# Patient Record
Sex: Female | Born: 1970 | Hispanic: No | Smoking: Former smoker
Health system: Southern US, Community
[De-identification: ages and names within clinical notes are randomized; demographics above are authoritative.]

## PROBLEM LIST (undated history)

## (undated) DIAGNOSIS — E559 Vitamin D deficiency, unspecified: Secondary | ICD-10-CM

## (undated) DIAGNOSIS — M25472 Effusion, left ankle: Secondary | ICD-10-CM

## (undated) DIAGNOSIS — M25474 Effusion, right foot: Secondary | ICD-10-CM

## (undated) DIAGNOSIS — R5383 Other fatigue: Secondary | ICD-10-CM

## (undated) DIAGNOSIS — M419 Scoliosis, unspecified: Secondary | ICD-10-CM

## (undated) DIAGNOSIS — H905 Unspecified sensorineural hearing loss: Secondary | ICD-10-CM

## (undated) DIAGNOSIS — H5210 Myopia, unspecified eye: Secondary | ICD-10-CM

## (undated) DIAGNOSIS — J45909 Unspecified asthma, uncomplicated: Secondary | ICD-10-CM

## (undated) DIAGNOSIS — R0602 Shortness of breath: Secondary | ICD-10-CM

## (undated) DIAGNOSIS — I2542 Coronary artery dissection: Secondary | ICD-10-CM

## (undated) DIAGNOSIS — E785 Hyperlipidemia, unspecified: Secondary | ICD-10-CM

## (undated) DIAGNOSIS — M25471 Effusion, right ankle: Secondary | ICD-10-CM

## (undated) DIAGNOSIS — Z86718 Personal history of other venous thrombosis and embolism: Secondary | ICD-10-CM

## (undated) DIAGNOSIS — I2510 Atherosclerotic heart disease of native coronary artery without angina pectoris: Secondary | ICD-10-CM

## (undated) DIAGNOSIS — R079 Chest pain, unspecified: Secondary | ICD-10-CM

## (undated) DIAGNOSIS — M25475 Effusion, left foot: Secondary | ICD-10-CM

## (undated) DIAGNOSIS — I252 Old myocardial infarction: Secondary | ICD-10-CM

## (undated) HISTORY — DX: Shortness of breath: R06.02

## (undated) HISTORY — DX: Effusion, left ankle: M25.472

## (undated) HISTORY — DX: Effusion, right ankle: M25.471

## (undated) HISTORY — DX: Unspecified asthma, uncomplicated: J45.909

## (undated) HISTORY — DX: Scoliosis, unspecified: M41.9

## (undated) HISTORY — DX: Old myocardial infarction: I25.2

## (undated) HISTORY — DX: Effusion, right foot: M25.474

## (undated) HISTORY — DX: Vitamin D deficiency, unspecified: E55.9

## (undated) HISTORY — DX: Personal history of other venous thrombosis and embolism: Z86.718

## (undated) HISTORY — PX: TONSILLECTOMY: SUR1361

## (undated) HISTORY — DX: Myopia, unspecified eye: H52.10

## (undated) HISTORY — DX: Unspecified sensorineural hearing loss: H90.5

## (undated) HISTORY — DX: Other fatigue: R53.83

## (undated) HISTORY — DX: Chest pain, unspecified: R07.9

## (undated) HISTORY — DX: Effusion, left foot: M25.475

## (undated) HISTORY — PX: WISDOM TOOTH EXTRACTION: SHX21

---

## 1993-05-08 HISTORY — PX: LEEP: SHX91

## 1997-08-18 ENCOUNTER — Other Ambulatory Visit: Admission: RE | Admit: 1997-08-18 | Discharge: 1997-08-18 | Payer: Self-pay | Admitting: Obstetrics

## 2005-11-07 ENCOUNTER — Emergency Department (HOSPITAL_COMMUNITY): Admission: EM | Admit: 2005-11-07 | Discharge: 2005-11-07 | Payer: Self-pay | Admitting: Family Medicine

## 2006-09-13 ENCOUNTER — Ambulatory Visit: Payer: Self-pay | Admitting: Vascular Surgery

## 2006-10-23 ENCOUNTER — Ambulatory Visit: Payer: Self-pay | Admitting: Vascular Surgery

## 2007-01-22 ENCOUNTER — Ambulatory Visit: Payer: Self-pay | Admitting: Vascular Surgery

## 2007-04-29 ENCOUNTER — Ambulatory Visit: Payer: Self-pay | Admitting: Vascular Surgery

## 2007-05-10 ENCOUNTER — Ambulatory Visit: Payer: Self-pay | Admitting: Vascular Surgery

## 2007-08-13 ENCOUNTER — Ambulatory Visit: Payer: Self-pay | Admitting: Vascular Surgery

## 2007-08-26 ENCOUNTER — Ambulatory Visit: Payer: Self-pay | Admitting: Vascular Surgery

## 2010-09-20 NOTE — Assessment & Plan Note (Signed)
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  She developed varicosities  during her first pregnancy in 1998 on the right side and eventually  developed the same symptoms on the left side.  She describes throbbing,  aching, and cramping discomfort in the thigh and calf areas on the right  and in the calf area on the left.  She has been wearing elastic  compression stockings and has also tried Motrin and elevation of the  legs and has had no relief of her symptoms.  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  Venous duplex exam was performed  in our office on October 23, 2006, which reveals reflux at the  saphenofemoral junction bilaterally and throughout both greater  saphenous veins.  She would be a good candidate for laser ablation of  both greater saphenous veins with multiple stab phlebectomies, beginning  with the right side to relieve these severe symptoms which she is  experiencing.  We will proceed with precertification and try to perform  this procedure as soon as possible to improve her ability to work.   Quita Skye Hart Rochester, M.D.  Electronically Signed   JDL/MEDQ  D:  01/22/2007  T:  01/23/2007  Job:  [id number]

## 2010-09-20 NOTE — Assessment & Plan Note (Signed)
OFFICE VISIT   Stacey Garner, Stacey Garner  DOB:  July 16, 1970                                       08/13/2007  AVWUJ#:[id number]   The patient returns today for further evaluation of her venous  insufficiency.  She has previously undergone laser ablation of her right  great saphenous vein with multiple stab phlebectomies.  She has had an  excellent cosmetic result and is having no distal edema, is not wearing  elastic compression stockings on the right leg at this time.  She does  have an achy feeling in the leg, however, during the day and felt a  lump near the right inguinal area which she was concerned about.  She  continues to have symptomatic varicosities in her left leg over the  greater saphenous system in the distal thigh and calf which are not  relieved by conservative measures.  She is scheduled for laser ablation  of her left greater saphenous vein with multiple stab phlebectomies on  April 20.   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  By handheld Doppler the saphenous vein  is totally occluded from just adjacent to the saphenofemoral junction  distally on the right side.  No deep venous abnormalities are noted and  she has a palpable dorsalis pedis pulses in the right foot and she was  reassured regarding this.  The left leg has been previously studied and  she will return in 2 weeks for her laser ablation procedure of the left  greater saphenous vein.  She will have stab phlebectomies in the thigh  and calf.  Blood pressure today is 110/70, heart rate is 60,  respirations 14.   Quita Skye. Hart Rochester, M.D.  Electronically Signed   JDL/MEDQ  D:  08/13/2007  T:  08/14/2007  Job:  [id number]

## 2010-09-20 NOTE — Assessment & Plan Note (Signed)
OFFICE VISIT   Stacey Garner, Stacey Garner  DOB:  May 12, 1970                                       05/10/2007  EAVWU#:[id number]   The patient presents today for followup of her right leg greater  saphenous vein laser ablation and stab phlebectomy on 04/29/2007.  She  has done well since her procedure.  She does have the usual amount of  erythema and soreness in her right thigh, and minimal discomfort at her  stab phlebectomy sites.  She does have moderate bruising in her mid  upper thigh and mild bruising around the phlebectomy sites.  The  phlebectomy sites are all healed without difficulty.  She underwent  limited venous duplex in our office today, and this revealed closure and  occlusion of her saphenous vein from her knee to her saphenofemoral  junction.  Her common femoral vein is widely patent with no evidence of  thrombus.  She is reassured with this.  She reports that Motrin has been  upsetting her stomach, and she was encouraged to try Tylenol for  discomfort.  She has been instructed to resume full activity without  limitation, and she will be seen by Dr. Hart Rochester in 6 weeks for final  followup.   Larina Earthly, M.D.  Electronically Signed   TFE/MEDQ  D:  05/10/2007  T:  05/13/2007  Job:  [id number]   cc:   Quita Skye. Hart Rochester, M.D.

## 2012-02-23 ENCOUNTER — Encounter: Payer: Self-pay | Admitting: Medical

## 2012-02-23 ENCOUNTER — Ambulatory Visit (INDEPENDENT_AMBULATORY_CARE_PROVIDER_SITE_OTHER): Payer: BC Managed Care – PPO | Admitting: Medical

## 2012-02-23 ENCOUNTER — Telehealth: Payer: Self-pay | Admitting: Family Medicine

## 2012-02-23 VITALS — BP 118/80 | HR 76 | Temp 98.3°F | Resp 16 | Ht 66.0 in | Wt 195.0 lb

## 2012-02-23 DIAGNOSIS — Z0000 Encounter for general adult medical examination without abnormal findings: Secondary | ICD-10-CM

## 2012-02-23 DIAGNOSIS — J45909 Unspecified asthma, uncomplicated: Secondary | ICD-10-CM

## 2012-02-23 MED ORDER — MOMETASONE FUROATE 220 MCG/INH IN AEPB: 2.0000 | INHALATION_SPRAY | Freq: Every day | RESPIRATORY_TRACT | Status: DC

## 2012-02-23 MED ORDER — ALBUTEROL SULFATE HFA 108 (90 BASE) MCG/ACT IN AERS: 2.0000 | INHALATION_SPRAY | Freq: Four times a day (QID) | RESPIRATORY_TRACT | Status: DC | PRN

## 2012-02-23 NOTE — Patient Instructions (Addendum)

## 2012-02-23 NOTE — Progress Notes (Signed)
Subjective:   HPI  Stacey Garner is a 41 y.o. female who presents for a complete physical.  New patient today.  Sees eye doctor and dentist regularly.  Is mostly vegetarian.  Prior was seeing [facility] for medical issues.  Wants female gyn referral.   Asthma - hx/o asthma, usually no issues, but lately been having some chest tightness, SOB at times.  Started with cough runny nose and sneezing few weeks ago but now mostly tight chest.  No chest pain ,no edema, no palpations. No associated sweats, paresthesias.    Weight gain - 20lb increase over the past year.  Reviewed their medical, surgical, family, social, medication, and allergy history and updated chart as appropriate.  Past Medical History  Diagnosis Date  . Asthma   . Nearsightedness     wears glasses sometimes  . Congenital hearing disorder   . Scoliosis     mild    Past Surgical History  Procedure Date  . Tonsillectomy   . Wisdom tooth extraction   . Leep 1995    Family History  Problem Relation Age of Onset  . Osteoporosis Mother   . Scoliosis Mother   . Alcohol abuse Father     died of ETOH abuse  . Cancer Father     possible pancreatic  cancer  . Thyroid disease Maternal Aunt   . Stroke Paternal Uncle   . Cancer Maternal Grandmother 70    ovarian  . Heart disease Paternal Grandmother   . Diabetes Neg Hx     History   Social History  . Marital Status: Married    Spouse Name: N/A    Number of Children: N/A  . Years of Education: N/A   Occupational History  . social worker [employer]   Social History Main Topics  . Smoking status: Never Smoker   . Smokeless tobacco: Not on file  . Alcohol Use: No  . Drug Use: No  . Sexually Active: Not on file   Other Topics Concern  . Not on file   Social History Narrative   Buddhist, divorced, 2 daughters, age 35yo and 38yo, exercises 3 days per week 45 min each    No current outpatient prescriptions on file prior to visit.      No Known Allergies  Review of Systems Constitutional: -fever, -chills, -sweats, +unexpected weight change, -anorexia, -fatigue Allergy: +sneezing, -itching, -congestion Dermatology: denies changing moles, rash, lumps, new worrisome lesions ENT: -runny nose, -ear pain, -sore throat, -hoarseness, -sinus pain, -teeth pain, -tinnitus, -hearing loss, -epistaxis Cardiology:  -chest pain, -palpitations, -edema, -orthopnea, -paroxysmal nocturnal dyspnea Respiratory: +cough, -shortness of breath, -dyspnea on exertion, -wheezing, -hemoptysis Gastroenterology: -abdominal pain, -nausea, -vomiting, -diarrhea, -constipation, -blood in stool, -changes in bowel movement, -dysphagia Hematology: -bleeding or bruising problems Musculoskeletal: -arthralgias, -myalgias, -joint swelling, -back pain, -neck pain, -cramping, -gait changes Ophthalmology: -vision changes, -eye redness, -itching, -discharge Urology: -dysuria, -difficulty urinating, -hematuria, -urinary frequency, -urgency, incontinence Neurology: -headache, -weakness, -tingling, -numbness, -speech abnormality, -memory loss, -falls, -dizziness Psychology:  -depressed mood, -agitation, -sleep problems     Objective:   Physical Exam  Filed Vitals:   02/23/12 1427  BP: 118/80  Pulse: 76  Temp: 98.3 F (36.8 C)  Resp: 16    General appearance: alert, no distress, WD/WN, white female  Skin: few scattered macules, no worrisome lesions HEENT: normocephalic, conjunctiva/corneas normal, sclerae anicteric, PERRLA, EOMi, nares patent, no discharge or erythema, pharynx normal Oral cavity: MMM, tongue normal, teeth in good repair  Neck: supple, no lymphadenopathy, no thyromegaly, no masses, normal ROM, no bruits Chest: non tender, normal shape and expansion Heart: RRR, normal S1, S2, no murmurs Lungs: CTA bilaterally, no wheezes, rhonchi, or rales Abdomen: +bs, soft, non tender, non distended, no masses, no hepatomegaly, no splenomegaly, no  bruits Back: mild thoracolumbar scoliosis, non tender, normal ROM Musculoskeletal: upper extremities non tender, no obvious deformity, normal ROM throughout, lower extremities non tender, no obvious deformity, normal ROM throughout Extremities: no edema, no cyanosis, no clubbing Pulses: 2+ symmetric, upper and lower extremities, normal cap refill Neurological: alert, oriented x 3, CN2-12 intact, strength normal upper extremities and lower extremities, sensation normal throughout, DTRs 2+ throughout, no cerebellar signs, gait normal Psychiatric: normal affect, behavior normal, pleasant  Breast/gyn/rectal - deferred to gynecology    Assessment and Plan :     Encounter Diagnoses  Name Primary?  . Routine general medical examination at a health care facility Yes  . Asthma     Physical exam - discussed healthy lifestyle, diet, exercise, preventative care, vaccinations, and addressed their concerns.  Handout given.  Referral to female gynecologist at her request.   Asthma - begin Asmanex daily for next 1-2 mo.   albuterol prn.  Discussed proper use, inhaler demo today, discussed difference between controller vs rescue medication.   She declines daily antihistamine tablet, declines flu and pneumococcal vaccines.   Follow-up for fasting labs.

## 2012-02-23 NOTE — Telephone Encounter (Signed)
PATIENT IS AWARE OF HER APPOINTMENT TO SEE DR. MODY ON 03-11-12 AT 245 PM WITH DR. MODY. CLS   [facility] [phone]

## 2012-02-26 LAB — POCT URINALYSIS DIPSTICK
Blood, UA: NEGATIVE
Glucose, UA: NEGATIVE
Nitrite, UA: NEGATIVE
Protein, UA: NEGATIVE
Urobilinogen, UA: NEGATIVE

## 2012-03-26 ENCOUNTER — Other Ambulatory Visit: Payer: BC Managed Care – PPO

## 2012-03-26 DIAGNOSIS — Z0000 Encounter for general adult medical examination without abnormal findings: Secondary | ICD-10-CM

## 2012-03-26 LAB — CBC WITH DIFFERENTIAL/PLATELET
Basophils Relative: 0 % (ref 0–1)
Eosinophils Absolute: 0.1 10*3/uL (ref 0.0–0.7)
Eosinophils Relative: 1 % (ref 0–5)
HCT: 44.2 % (ref 36.0–46.0)
Hemoglobin: 15.2 g/dL — ABNORMAL HIGH (ref 12.0–15.0)
MCH: 30.4 pg (ref 26.0–34.0)
MCHC: 34.4 g/dL (ref 30.0–36.0)
MCV: 88.4 fL (ref 78.0–100.0)
Monocytes Absolute: 0.4 10*3/uL (ref 0.1–1.0)
Monocytes Relative: 8 % (ref 3–12)
Neutrophils Relative %: 62 % (ref 43–77)

## 2012-03-26 LAB — COMPREHENSIVE METABOLIC PANEL
AST: 12 U/L (ref 0–37)
Albumin: 3.9 g/dL (ref 3.5–5.2)
Alkaline Phosphatase: 60 U/L (ref 39–117)
BUN: 13 mg/dL (ref 6–23)
Potassium: 4.2 mEq/L (ref 3.5–5.3)
Total Bilirubin: 0.5 mg/dL (ref 0.3–1.2)

## 2012-03-26 LAB — LIPID PANEL
HDL: 36 mg/dL — ABNORMAL LOW (ref >39)
Total CHOL/HDL Ratio: 5 Ratio
VLDL: 42 mg/dL — ABNORMAL HIGH (ref 0–40)

## 2012-08-12 ENCOUNTER — Emergency Department (HOSPITAL_COMMUNITY): Payer: BC Managed Care – PPO

## 2012-08-12 ENCOUNTER — Encounter (HOSPITAL_COMMUNITY): Payer: Self-pay | Admitting: Nurse Practitioner

## 2012-08-12 ENCOUNTER — Inpatient Hospital Stay (HOSPITAL_COMMUNITY)
Admission: EM | Admit: 2012-08-12 | Discharge: 2012-08-14 | DRG: 121 | Disposition: A | Discharge status: Home / Self Care | Payer: BC Managed Care – PPO | Attending: Cardiovascular Disease | Admitting: Cardiovascular Disease

## 2012-08-12 DIAGNOSIS — Z9089 Acquired absence of other organs: Secondary | ICD-10-CM

## 2012-08-12 DIAGNOSIS — R079 Chest pain, unspecified: Secondary | ICD-10-CM

## 2012-08-12 DIAGNOSIS — K219 Gastro-esophageal reflux disease without esophagitis: Secondary | ICD-10-CM | POA: Diagnosis present

## 2012-08-12 DIAGNOSIS — H918X9 Other specified hearing loss, unspecified ear: Secondary | ICD-10-CM | POA: Diagnosis present

## 2012-08-12 DIAGNOSIS — I2542 Coronary artery dissection: Secondary | ICD-10-CM

## 2012-08-12 DIAGNOSIS — Z87891 Personal history of nicotine dependence: Secondary | ICD-10-CM

## 2012-08-12 DIAGNOSIS — I200 Unstable angina: Secondary | ICD-10-CM

## 2012-08-12 DIAGNOSIS — J45909 Unspecified asthma, uncomplicated: Secondary | ICD-10-CM | POA: Diagnosis present

## 2012-08-12 DIAGNOSIS — I214 Non-ST elevation (NSTEMI) myocardial infarction: Principal | ICD-10-CM

## 2012-08-12 DIAGNOSIS — Z7982 Long term (current) use of aspirin: Secondary | ICD-10-CM

## 2012-08-12 DIAGNOSIS — E785 Hyperlipidemia, unspecified: Secondary | ICD-10-CM

## 2012-08-12 DIAGNOSIS — I2510 Atherosclerotic heart disease of native coronary artery without angina pectoris: Secondary | ICD-10-CM

## 2012-08-12 DIAGNOSIS — Z79899 Other long term (current) drug therapy: Secondary | ICD-10-CM

## 2012-08-12 DIAGNOSIS — M4120 Other idiopathic scoliosis, site unspecified: Secondary | ICD-10-CM | POA: Diagnosis present

## 2012-08-12 HISTORY — DX: Coronary artery dissection: I25.42

## 2012-08-12 HISTORY — DX: Hyperlipidemia, unspecified: E78.5

## 2012-08-12 HISTORY — DX: Atherosclerotic heart disease of native coronary artery without angina pectoris: I25.10

## 2012-08-12 LAB — CBC
MCH: 30.2 pg (ref 26.0–34.0)
MCHC: 35.1 g/dL (ref 30.0–36.0)
Platelets: 167 10*3/uL (ref 150–400)

## 2012-08-12 LAB — PRO B NATRIURETIC PEPTIDE: Pro B Natriuretic peptide (BNP): 11.2 pg/mL (ref 0–125)

## 2012-08-12 LAB — D-DIMER, QUANTITATIVE: D-Dimer, Quant: 0.47 ug/mL-FEU (ref 0.00–0.48)

## 2012-08-12 LAB — BASIC METABOLIC PANEL
Calcium: 9 mg/dL (ref 8.4–10.5)
GFR calc non Af Amer: >90 mL/min (ref >90)
Glucose, Bld: 121 mg/dL — ABNORMAL HIGH (ref 70–99)
Sodium: 140 mEq/L (ref 135–145)

## 2012-08-12 LAB — TROPONIN I
Troponin I: 1 ng/mL (ref <0.30)
Troponin I: 0.99 ng/mL (ref <0.30)

## 2012-08-12 MED ORDER — ASPIRIN EC 81 MG PO TBEC
81.0000 mg | DELAYED_RELEASE_TABLET | Freq: Every day | ORAL | Status: DC
  Administered 2012-08-14: 81 mg via ORAL
  Filled 2012-08-12 (×2): qty 1

## 2012-08-12 MED ORDER — SODIUM CHLORIDE 0.9 % IJ SOLN
3.0000 mL | Freq: Two times a day (BID) | INTRAMUSCULAR | Status: DC
  Administered 2012-08-13: 3 mL via INTRAVENOUS

## 2012-08-12 MED ORDER — ALPRAZOLAM 0.25 MG PO TABS
0.2500 mg | ORAL_TABLET | Freq: Two times a day (BID) | ORAL | Status: DC | PRN
  Administered 2012-08-12: 0.25 mg via ORAL
  Filled 2012-08-12: qty 1

## 2012-08-12 MED ORDER — HEPARIN BOLUS VIA INFUSION
4000.0000 [IU] | Freq: Once | INTRAVENOUS | Status: AC
  Administered 2012-08-12: 4000 [IU] via INTRAVENOUS

## 2012-08-12 MED ORDER — NITROGLYCERIN 0.4 MG SL SUBL
0.4000 mg | SUBLINGUAL_TABLET | SUBLINGUAL | Status: DC | PRN
  Administered 2012-08-12 (×2): 0.4 mg via SUBLINGUAL
  Filled 2012-08-12: qty 25

## 2012-08-12 MED ORDER — SODIUM CHLORIDE 0.9 % IV SOLN
INTRAVENOUS | Status: DC
  Administered 2012-08-13: 150 mL/h via INTRAVENOUS
  Administered 2012-08-13: 13:00:00 via INTRAVENOUS
  Administered 2012-08-13: 150 mL/h via INTRAVENOUS

## 2012-08-12 MED ORDER — ONDANSETRON HCL 4 MG/2ML IJ SOLN: 4.0000 mg | Freq: Four times a day (QID) | INTRAMUSCULAR | Status: DC | PRN

## 2012-08-12 MED ORDER — SODIUM CHLORIDE 0.9 % IV SOLN: 250.0000 mL | INTRAVENOUS | Status: DC | PRN

## 2012-08-12 MED ORDER — SODIUM CHLORIDE 0.9 % IJ SOLN: 3.0000 mL | INTRAMUSCULAR | Status: DC | PRN

## 2012-08-12 MED ORDER — ASPIRIN 81 MG PO CHEW
324.0000 mg | CHEWABLE_TABLET | Freq: Once | ORAL | Status: AC
  Administered 2012-08-12: 324 mg via ORAL
  Filled 2012-08-12: qty 4

## 2012-08-12 MED ORDER — ATORVASTATIN CALCIUM 80 MG PO TABS
80.0000 mg | ORAL_TABLET | Freq: Every day | ORAL | Status: DC
  Administered 2012-08-13 – 2012-08-14 (×2): 80 mg via ORAL
  Filled 2012-08-12 (×2): qty 1

## 2012-08-12 MED ORDER — ACETAMINOPHEN 325 MG PO TABS
650.0000 mg | ORAL_TABLET | ORAL | Status: DC | PRN
  Administered 2012-08-13: 650 mg via ORAL
  Filled 2012-08-12: qty 2

## 2012-08-12 MED ORDER — SODIUM CHLORIDE 0.9 % IJ SOLN
3.0000 mL | Freq: Two times a day (BID) | INTRAMUSCULAR | Status: DC
  Administered 2012-08-12: 3 mL via INTRAVENOUS
  Administered 2012-08-13: 08:00:00 via INTRAVENOUS

## 2012-08-12 MED ORDER — SODIUM CHLORIDE 0.9 % IV SOLN
250.0000 mL | INTRAVENOUS | Status: DC | PRN
  Administered 2012-08-12: 10 mL/h via INTRAVENOUS

## 2012-08-12 MED ORDER — ASPIRIN 325 MG PO TABS: 325.0000 mg | ORAL_TABLET | Freq: Once | ORAL | Status: DC

## 2012-08-12 MED ORDER — METOPROLOL TARTRATE 12.5 MG HALF TABLET
12.5000 mg | ORAL_TABLET | Freq: Two times a day (BID) | ORAL | Status: DC
  Administered 2012-08-12 – 2012-08-14 (×4): 12.5 mg via ORAL
  Filled 2012-08-12 (×5): qty 1

## 2012-08-12 MED ORDER — ZOLPIDEM TARTRATE 5 MG PO TABS: 5.0000 mg | ORAL_TABLET | Freq: Every evening | ORAL | Status: DC | PRN

## 2012-08-12 MED ORDER — HEPARIN (PORCINE) IN NACL 100-0.45 UNIT/ML-% IJ SOLN
1200.0000 [IU]/h | INTRAMUSCULAR | Status: DC
  Administered 2012-08-12: 1200 [IU]/h via INTRAVENOUS
  Filled 2012-08-12 (×2): qty 250

## 2012-08-12 MED ORDER — ASPIRIN 81 MG PO CHEW
324.0000 mg | CHEWABLE_TABLET | ORAL | Status: AC
  Administered 2012-08-13: 324 mg via ORAL
  Filled 2012-08-12: qty 4

## 2012-08-12 NOTE — Progress Notes (Signed)
ANTICOAGULATION CONSULT NOTE - Initial Consult  Pharmacy Consult for heparin Indication: chest pain/ACS  No Known Allergies  Patient Measurements:   Heparin Dosing Weight: 85kg  Vital Signs: Temp: 98.2 F (36.8 C) (04/07 1228) Temp src: Oral (04/07 1228) BP: 152/82 mmHg (04/07 1700) Pulse Rate: 90 (04/07 1700)  Labs:  Recent Labs  08/12/12 1308 08/12/12 1440  HGB 15.6*  --   HCT 44.5  --   PLT 167  --   CREATININE 0.77  --   TROPONINI  --  1.00*    The CrCl is unknown because both a height and weight (above a minimum accepted value) are required for this calculation.   Medical History: Past Medical History  Diagnosis Date  . Asthma   . Nearsightedness     wears glasses sometimes  . Congenital hearing disorder   . Scoliosis     mild    Medications:  None pta  Assessment: 42 year old with chest pain earlier today and now with positive cardiac enzymes. No prior cardiac history. Orders to initiate IV heparin and plan for cath in am 4/8. CBC within normal limits.  Goal of Therapy:  Heparin level 0.3-0.7 units/ml Monitor platelets by anticoagulation protocol: Yes   Plan:  Give 4000 units bolus x 1 Start heparin infusion at 1200 units/hr Check anti-Xa level in 6 hours and daily while on heparin Continue to monitor H&H and platelets  Sheppard Coil PharmD., BCPS Clinical Pharmacist Pager [phone] 08/12/2012 6:39 PM

## 2012-08-12 NOTE — ED Provider Notes (Signed)
History     CSN: [id number]  Arrival date & time 08/12/12  1220   First MD Initiated Contact with Patient 08/12/12 1231      Chief Complaint  Patient presents with  . Chest Pain    (Consider location/radiation/quality/duration/timing/severity/associated sxs/prior treatment) HPI.... sudden onset stabbing anterior chest pain radiating to back and left arm just prior to admission. No nausea, diaphoresis, dyspnea. No previous cardiac problems.  Nonsmoker. Has hypercholesterolemia, but is on no medication. No family history of cardiac disease.  Nothing makes symptoms better or worse. Severity was moderate to severe.  Past Medical History  Diagnosis Date  . Asthma   . Nearsightedness     wears glasses sometimes  . Congenital hearing disorder   . Scoliosis     mild    Past Surgical History  Procedure Laterality Date  . Tonsillectomy    . Wisdom tooth extraction    . Leep  1995    Family History  Problem Relation Age of Onset  . Osteoporosis Mother   . Scoliosis Mother   . Alcohol abuse Father     died of ETOH abuse  . Cancer Father     possible pancreatic  cancer  . Thyroid disease Maternal Aunt   . Stroke Paternal Uncle   . Cancer Maternal Grandmother 70    ovarian  . Heart disease Paternal Grandmother   . Diabetes Neg Hx     History  Substance Use Topics  . Smoking status: Never Smoker   . Smokeless tobacco: Not on file  . Alcohol Use: No    OB History   Grav Para Term Preterm Abortions TAB SAB Ect Mult Living                  Review of Systems  All other systems reviewed and are negative.    Allergies  Review of patient's allergies indicates no known allergies.  Home Medications  No current outpatient prescriptions on file.  BP 141/91  Pulse 77  Temp(Src) 98.2 F (36.8 C) (Oral)  Resp 20  SpO2 99%  LMP 08/10/2012  Physical Exam  Nursing note and vitals reviewed. Constitutional: She is oriented to person, place, and time. She appears  well-developed and well-nourished.  HENT:  Head: Normocephalic and atraumatic.  Eyes: Conjunctivae and EOM are normal. Pupils are equal, round, and reactive to light.  Neck: Normal range of motion. Neck supple.  Cardiovascular: Normal rate, regular rhythm and normal heart sounds.   Pulmonary/Chest: Effort normal and breath sounds normal.  Abdominal: Soft. Bowel sounds are normal.  Musculoskeletal: Normal range of motion.  Neurological: She is alert and oriented to person, place, and time.  Skin: Skin is warm and dry.  Psychiatric: She has a normal mood and affect.    ED Course  Procedures (including critical care time)  Labs Reviewed  CBC - Abnormal; Notable for the following:    RBC 5.16 (*)    Hemoglobin 15.6 (*)    All other components within normal limits  BASIC METABOLIC PANEL - Abnormal; Notable for the following:    Glucose, Bld 121 (*)    All other components within normal limits  TROPONIN I - Abnormal; Notable for the following:    Troponin I 1.00 (*)    All other components within normal limits  POCT I-STAT TROPONIN I - Abnormal; Notable for the following:    Troponin i, poc 0.09 (*)    All other components within normal limits  PRO B NATRIURETIC  PEPTIDE   Dg Chest 2 View  08/12/2012  *RADIOLOGY REPORT*  Clinical Data: Chest pain  CHEST - 2 VIEW  Comparison: None.  Findings: The heart and pulmonary vascularity are within normal limits.  The lungs are clear bilaterally.  No acute bony abnormality is seen.  IMPRESSION: No acute abnormality noted.   Original Report Authenticated By: Alcide Clever, M.D.     Date: 08/12/2012  Rate: 77  Rhythm: normal sinus rhythm  QRS Axis: normal  Intervals: normal  ST/T Wave abnormalities: normal  Conduction Disutrbances: none  Narrative Interpretation: unremarkable   Date: 08/12/2012  Rate: 84  Rhythm: normal sinus rhythm  QRS Axis: normal  Intervals: normal  ST/T Wave abnormalities: normal  Conduction Disutrbances: none   Narrative Interpretation: unremarkable      No diagnosis found.    MDM  Patient has good history for potential acute coronary syndrome.  Risk factor profile low.  Serum troponin 1.00.   EKG normal.  Consult cardiology         Donnetta Hutching, MD 08/12/12 [phone]

## 2012-08-12 NOTE — H&P (Signed)
CONSULT NOTE  Date: 08/12/2012               Patient Name:  Stacey Garner MRN: [id number]  DOB: 10/16/1970 Age / Sex: 42 y.o., female        PCP: Ernst Breach Primary Cardiologist: New to [facility]            Referring Physician: Donnetta Hutching, MD              Reason for Consult: Chest pain. + troponin           History of Present Illness: Patient is a 41 y.o. female with no significant PMH, who was admitted to [facility] on 08/12/2012 for evaluation of chest pain  She was driving back to school after a conference.  She developed a sharp CP, radiated to her left arm.  This severe She got out of her car, and had still had a dull tightness in her chest.   The pain radiated to her back.   The pain gradually eased after 15 minutes.  A co-worker at her school told her to go to the ER.   No pleuretic cp, no nausea, vomitting, .no dyspnea  She does not get any regular exercise.  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  Medications: Outpatient medications: none  (Not in a hospital admission)  Current medications: No current facility-administered medications for this encounter.   No current outpatient prescriptions on file.     No Known Allergies   Past Medical History  Diagnosis Date  . Asthma   . Nearsightedness     wears glasses sometimes  . Congenital hearing disorder   . Scoliosis     mild    Past Surgical History  Procedure Laterality Date  . Tonsillectomy    . Wisdom tooth extraction    . Leep  1995    Family History  Problem Relation Age of Onset  . Osteoporosis Mother   . Scoliosis Mother   . Alcohol abuse Father     died of ETOH abuse  . Cancer Father     possible pancreatic  cancer  . Thyroid disease Maternal Aunt   . Stroke Paternal Uncle   . Cancer Maternal Grandmother 70    ovarian  . Heart disease Paternal Grandmother   . Diabetes Neg  Hx     Social History:  reports that she has never smoked. She does not have any smokeless tobacco history on file. She reports that she does not drink alcohol or use illicit drugs.   Review of Systems: Constitutional:  denies fever, chills, diaphoresis, appetite change and fatigue.  HEENT: denies photophobia, eye pain, redness, hearing loss, ear pain, congestion, sore throat, rhinorrhea, sneezing, neck pain, neck stiffness and tinnitus.  Respiratory: admits to  chest tightness.  Denies  wheezing.  Cardiovascular: admits to chest pain, denies palpitations and leg swelling.  Gastrointestinal: denies nausea, vomiting, abdominal pain, diarrhea, constipation, blood in stool.  Genitourinary: denies dysuria, urgency, frequency, hematuria, flank pain and difficulty urinating.  Musculoskeletal: denies  myalgias, back pain, joint swelling, arthralgias and gait problem.   Skin: denies pallor, rash and wound.  Neurological: denies dizziness, seizures, syncope, weakness, light-headedness, numbness and headaches.   Hematological: denies adenopathy, easy bruising, personal or family bleeding history.  Psychiatric/ Behavioral: denies suicidal ideation, mood changes, confusion, nervousness, sleep disturbance  and agitation.    Physical Exam: BP 152/82  Pulse 90  Temp(Src) 98.2 F (36.8 C) (Oral)  Resp 17  SpO2 100%  LMP 08/10/2012  General: Vital signs reviewed and noted. Well-developed, well-nourished, in no acute distress; alert, appropriate and cooperative throughout examination.  Head: Normocephalic, atraumatic, sclera anicteric, mucus membranes are moist  Neck: Supple. Negative for carotid bruits. JVD not elevated.  Lungs:  Clear bilaterally to auscultation without wheezes, rales, or rhonchi.   Heart: RRR with S1 S2. No murmurs, rubs, or gallops appreciated.  Abdomen:  Soft, non-tender, non-distended with normoactive bowel sounds. No hepatomegaly. No rebound/guarding. No obvious abdominal  masses  MSK: Strength and the appear normal for age.  Extremities: No clubbing or cyanosis. No edema.  Distal pedal pulses are 2+ and equal bilaterally.  Neurologic: Alert and oriented X 3. Moves all extremities spontaneously.  Psych: Responds to questions appropriately with a normal affect.    Lab results: Basic Metabolic Panel:  Recent Labs Lab 08/12/12 1308  NA 140  K 3.7  CL 103  CO2 28  GLUCOSE 121*  BUN 13  CREATININE 0.77  CALCIUM 9.0    Liver Function Tests: No results found for this basename: AST, ALT, ALKPHOS, BILITOT, PROT, ALBUMIN,  in the last 168 hours No results found for this basename: LIPASE, AMYLASE,  in the last 168 hours No results found for this basename: AMMONIA,  in the last 168 hours  CBC:  Recent Labs Lab 08/12/12 1308  WBC 6.1  HGB 15.6*  HCT 44.5  MCV 86.2  PLT 167    Cardiac Enzymes:  Recent Labs Lab 08/12/12 1440  TROPONINI 1.00*    BNP: No components found with this basename: POCBNP,   CBG: No results found for this basename: GLUCAP,  in the last 168 hours  Coagulation Studies: No results found for this basename: LABPROT, INR,  in the last 72 hours   Other results: EKG :  NSR, no ST or T wave changes  Imaging: Dg Chest 2 View  08/12/2012  *RADIOLOGY REPORT*  Clinical Data: Chest pain  CHEST - 2 VIEW  Comparison: None.  Findings: The heart and pulmonary vascularity are within normal limits.  The lungs are clear bilaterally.  No acute bony abnormality is seen.  IMPRESSION: No acute abnormality noted.   Original Report Authenticated By: Alcide Clever, M.D.        Assessment & Plan: 1. Chest pain:  She had a fairly serious episode of CP earlier today.  Sharp, severe CP for 5 minutes followed by 15-20 minutes of a dull, tight pain.  Her Troponin levels are 1.0  Her ECG is normal.   She denies any pleuretic CP and is not dyspneac-- I doubt this is a pulmonary embolus.   I worry about a coronary dissection.    Will start  her on heparin tonight.  I have discussed risks, benefits, options.  She understands and agrees to proceed.  Will schedule for tomorrow.  Will check lipids tomorrow.     Vesta Mixer, Montez Hageman., MD, [facility] 08/12/2012, 5:56 PM

## 2012-08-12 NOTE — ED Notes (Signed)
Pt reports L chest tightness and L shoulder pain similar to the incident that brought her to [facility]. Will administer ntg sl.

## 2012-08-12 NOTE — ED Notes (Signed)
States she was driving 40 minutes ago and felt a sharp stabbing pain in her chest down her L shoulder, arm and back. States pain decreased but a mild tightness remains. Pt denies SOB or nausea. States she feels scared right now

## 2012-08-13 ENCOUNTER — Encounter (HOSPITAL_COMMUNITY)
Admission: EM | Disposition: A | Discharge status: Home / Self Care | Payer: Self-pay | Source: Home / Self Care | Attending: Cardiovascular Disease

## 2012-08-13 DIAGNOSIS — I2542 Coronary artery dissection: Secondary | ICD-10-CM

## 2012-08-13 DIAGNOSIS — I2510 Atherosclerotic heart disease of native coronary artery without angina pectoris: Secondary | ICD-10-CM

## 2012-08-13 HISTORY — PX: CARDIAC CATHETERIZATION: SHX172

## 2012-08-13 HISTORY — DX: Coronary artery dissection: I25.42

## 2012-08-13 HISTORY — DX: Atherosclerotic heart disease of native coronary artery without angina pectoris: I25.10

## 2012-08-13 HISTORY — PX: LEFT HEART CATHETERIZATION WITH CORONARY ANGIOGRAM: SHX5451

## 2012-08-13 LAB — CBC
HCT: 43.5 % (ref 36.0–46.0)
MCHC: 35.6 g/dL (ref 30.0–36.0)
MCV: 86.8 fL (ref 78.0–100.0)
RDW: 11.8 % (ref 11.5–15.5)

## 2012-08-13 LAB — LIPID PANEL
HDL: 43 mg/dL (ref >39)
LDL Cholesterol: 169 mg/dL — ABNORMAL HIGH (ref 0–99)
Triglycerides: 96 mg/dL (ref <150)
VLDL: 19 mg/dL (ref 0–40)

## 2012-08-13 LAB — TSH: TSH: 3.01 u[IU]/mL (ref 0.350–4.500)

## 2012-08-13 LAB — TROPONIN I: Troponin I: 0.47 ng/mL (ref <0.30)

## 2012-08-13 SURGERY — LEFT HEART CATHETERIZATION WITH CORONARY ANGIOGRAM
Anesthesia: LOCAL

## 2012-08-13 MED ORDER — HEPARIN (PORCINE) IN NACL 2-0.9 UNIT/ML-% IJ SOLN
INTRAMUSCULAR | Status: AC
  Filled 2012-08-13: qty 1000

## 2012-08-13 MED ORDER — MIDAZOLAM HCL 2 MG/2ML IJ SOLN
INTRAMUSCULAR | Status: AC
  Filled 2012-08-13: qty 2

## 2012-08-13 MED ORDER — SODIUM CHLORIDE 0.9 % IV SOLN: 1.0000 mL/kg/h | INTRAVENOUS | Status: AC

## 2012-08-13 MED ORDER — HEPARIN SODIUM (PORCINE) 1000 UNIT/ML IJ SOLN
INTRAMUSCULAR | Status: AC
  Filled 2012-08-13: qty 1

## 2012-08-13 MED ORDER — FENTANYL CITRATE 0.05 MG/ML IJ SOLN
INTRAMUSCULAR | Status: AC
  Filled 2012-08-13: qty 2

## 2012-08-13 MED ORDER — VERAPAMIL HCL 2.5 MG/ML IV SOLN
INTRAVENOUS | Status: AC
  Filled 2012-08-13: qty 2

## 2012-08-13 MED ORDER — LIDOCAINE HCL (PF) 1 % IJ SOLN
INTRAMUSCULAR | Status: AC
  Filled 2012-08-13: qty 30

## 2012-08-13 NOTE — CV Procedure (Signed)
   Cardiac Catheterization Procedure Note  Name: Stacey Garner MRN: [id number] DOB: 1970/08/27  Procedure: Left Heart Cath, Selective Coronary Angiography, LV angiography  Indication: 42 yo F presents with an episode of severe chest pain with elevated troponin levels.   Procedural Details: The right wrist was prepped, draped, and anesthetized with 1% lidocaine. Using the modified Seldinger technique, a 5 French sheath was introduced into the right radial artery. 3 mg of verapamil was administered through the sheath, weight-based unfractionated heparin was administered intravenously. Standard Judkins catheters were used for selective coronary angiography and left ventriculography. Catheter exchanges were performed over an exchange length guidewire. There were no immediate procedural complications. A TR band was used for radial hemostasis at the completion of the procedure.  The patient was transferred to the post catheterization recovery area for further monitoring.  Procedural Findings: Hemodynamics: AO 106/65 mean 84 mm Hg LV 112/11 mm Hg  Coronary angiography: Coronary dominance: right  Left mainstem: Normal  Left anterior descending (LAD): Normal.  Left circumflex (LCx): The left circumflex gives rise to 2 marginal branches. The first marginal branch is diffusely narrowed from the proximal to mid vessel to 50-60%. There is occlusion of a small distal branch with dye staining. The second OM is normal.  Right coronary artery (RCA): Normal.  Left ventriculography: Left ventricular systolic function is normal, LVEF is estimated at 55-65%, there is no significant mitral regurgitation   Final Conclusions:   1. Single vessel coronary artery disease. 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 2. Normal LV function.  Recommendations: Medical management with ASA, beta blocker, and statin. Prn nitrates.  Theron Arista [facility] 08/13/2012, 2:41 PM

## 2012-08-13 NOTE — Progress Notes (Signed)
ANTICOAGULATION CONSULT NOTE - Follow Up Consult  Pharmacy Consult for heparin Indication: chest pain/ACS  No Known Allergies  Patient Measurements: Height: 5\' 5"  (165.1 cm) Weight: 195 lb 5.2 oz (88.6 kg) IBW/kg (Calculated) : 57 Heparin Dosing Weight: 85kg  Vital Signs: Temp: 98.7 F (37.1 C) (04/08 0400) Temp src: Oral (04/08 0400) BP: 116/75 mmHg (04/08 0400) Pulse Rate: 65 (04/08 0400)  Labs:  Recent Labs  08/12/12 1308 08/12/12 1440 08/12/12 2202 08/13/12 0425  HGB 15.6*  --   --  15.5*  HCT 44.5  --   --  43.5  PLT 167  --   --  185  LABPROT  --   --   --  13.6  INR  --   --   --  1.05  HEPARINUNFRC  --   --   --  0.39  CREATININE 0.77  --   --   --   TROPONINI  --  1.00* 0.99*  --     Estimated Creatinine Clearance: 103.7 ml/min (by C-G formula based on Cr of 0.77).   Medical History: Past Medical History  Diagnosis Date  . Asthma   . Nearsightedness     wears glasses sometimes  . Congenital hearing disorder   . Scoliosis     mild    Medications:  None pta  Assessment: 42 year old with chest pain earlier today and now with positive cardiac enzymes on IV heparin and plan for cath in am 4/8.   Heparin level (0.39) is at-goal on 1200 units/hr.   Goal of Therapy:  Heparin level 0.3-0.7 units/ml Monitor platelets by anticoagulation protocol: Yes   Plan:  1. Continue IV heparin at 1200 units/hr.  2. Heparin level in 6 hours to confirm dosing vs follow-up post-cath.   Lorre Munroe, PharmD 08/13/2012 5:13 AM

## 2012-08-13 NOTE — Interval H&P Note (Signed)
History and Physical Interval Note:  08/13/2012 2:07 PM  Stacey Garner  has presented today for surgery, with the diagnosis of Chest pain  The various methods of treatment have been discussed with the patient and family. After consideration of risks, benefits and other options for treatment, the patient has consented to  Procedure(s): LEFT HEART CATHETERIZATION WITH CORONARY ANGIOGRAM (N/A) as a surgical intervention .  The patient's history has been reviewed, patient examined, no change in status, stable for surgery.  I have reviewed the patient's chart and labs.  Questions were answered to the patient's satisfaction.     Theron [facility]ista [facility] 08/13/2012 2:07 PM

## 2012-08-13 NOTE — Care Management Note (Signed)
    Page 1 of 1   08/13/2012     9:35:46 AM   CARE MANAGEMENT NOTE 08/13/2012  Patient:  Stacey Garner, Stacey Garner   Account Number:  [id number]  Date Initiated:  08/13/2012  Documentation initiated by:  Junius Creamer  Subjective/Objective Assessment:   adm w angina     Action/Plan:   lives w husband, pcp dr Onalee Hua tysinger   Anticipated DC Date:     Anticipated DC Plan:  HOME/SELF CARE      DC Planning Services  CM consult      Choice offered to / List presented to:             Status of service:   Medicare Important Message given?   (If response is "NO", the following Medicare IM given date fields will be blank) Date Medicare IM given:   Date Additional Medicare IM given:    Discharge Disposition:  HOME/SELF CARE  Per UR Regulation:  Reviewed for med. necessity/level of care/duration of stay  If discussed at Long Length of Stay Meetings, dates discussed:    Comments:  4/8 0934 debbie Stacey Moccio rn,bsn

## 2012-08-13 NOTE — Progress Notes (Signed)
 CONSULT NOTE  Date: 08/13/2012               Patient Name:  Stacey Garner MRN: [id number]  DOB: 05/13/1970 Age / Sex: 41 y.o., female        PCP: TYSINGER, DAVID SHANE Primary Cardiologist: New to [facility]            Referring Physician: Brian Cook, MD              Reason for Consult: Chest pain. + troponin           History of Present Illness: Patient is a 41 y.o. female with no significant PMH, who was admitted to [facility] on 08/12/2012 for evaluation of chest pain  She was driving back to school after a conference.  She developed a sharp CP, radiated to her left arm.  This severe She got out of her car, and had still had a dull tightness in her chest.   The pain radiated to her back.   The pain gradually eased after 15 minutes.  A co-worker at her school told her to go to the ER.   No pleuretic cp, no nausea, vomitting, .no dyspnea She does not get any regular exercise.  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  She is feeling better this am. Troponin levels remain mildly elevated.  Medications: Outpatient medications: none No prescriptions prior to admission    Current medications: Current Facility-Administered Medications  Medication Dose Route Frequency Provider Last Rate Last Dose  . 0.9 %  sodium chloride infusion  250 mL Intravenous PRN Christopher R Berge, NP      . 0.9 %  sodium chloride infusion  250 mL Intravenous PRN Ariel Wingrove J Maiana Hennigan, MD 10 mL/hr at 08/12/12 2233 10 mL/hr at 08/12/12 2233  . 0.9 %  sodium chloride infusion   Intravenous Continuous [place] J Jen Eppinger, MD 150 mL/hr at 08/13/12 0605 150 mL/hr at 08/13/12 0605  . acetaminophen (TYLENOL) tablet 650 mg  650 mg Oral Q4H PRN Christopher R Berge, NP      . ALPRAZolam (XANAX) tablet 0.25 mg  0.25 mg Oral BID PRN Christopher R Berge, NP   0.25 mg at 08/12/12 2225  . aspirin EC tablet 81 mg  81 mg Oral Daily  Christopher R Berge, NP      . atorvastatin (LIPITOR) tablet 80 mg  80 mg Oral q1800 Christopher R Berge, NP      . heparin ADULT infusion 100 units/mL (25000 units/250 mL)  1,200 Units/hr Intravenous Continuous Frank Rhea Wilson, RPH 12 mL/hr at 08/12/12 2146 1,200 Units/hr at 08/12/12 2146  . metoprolol tartrate (LOPRESSOR) tablet 12.5 mg  12.5 mg Oral BID Christopher R Berge, NP   12.5 mg at 08/12/12 2222  . nitroGLYCERIN (NITROSTAT) SL tablet 0.4 mg  0.4 mg Sublingual Q5 Min x 3 PRN Christopher R Berge, NP   0.4 mg at 08/12/12 2106  . ondansetron (ZOFRAN) injection 4 mg  4 mg Intravenous Q6H PRN Christopher R Berge, NP      . sodium chloride 0.9 % injection 3 mL  3 mL Intravenous Q12H Christopher R Berge, NP      . sodium chloride 0.9 % injection 3 mL  3 mL Intravenous PRN Christopher R Berge, NP      . sodium chloride 0.9 % injection 3 mL  3 mL   Intravenous Q12H Cyree Chuong J Gerren Hoffmeier, MD   3 mL at 08/12/12 2223  . sodium chloride 0.9 % injection 3 mL  3 mL Intravenous PRN Jonai Weyland J Kileen Lange, MD      . zolpidem (AMBIEN) tablet 5 mg  5 mg Oral QHS PRN Christopher R Berge, NP         No Known Allergies   Past Medical History  Diagnosis Date  . Asthma   . Nearsightedness     wears glasses sometimes  . Congenital hearing disorder   . Scoliosis     mild    Past Surgical History  Procedure Laterality Date  . Tonsillectomy    . Wisdom tooth extraction    . Leep  1995    Family History  Problem Relation Age of Onset  . Osteoporosis Mother   . Scoliosis Mother   . Alcohol abuse Father     died of ETOH abuse  . Cancer Father     possible pancreatic  cancer  . Thyroid disease Maternal Aunt   . Stroke Paternal Uncle   . Cancer Maternal Grandmother 70    ovarian  . Heart disease Paternal Grandmother   . Diabetes Neg Hx     Social History:  reports that she quit smoking about 14 years ago. She does not have any smokeless tobacco history on file. She reports that she does not drink  alcohol or use illicit drugs.    Physical Exam: BP 106/76  Pulse 62  Temp(Src) 98.7 F (37.1 C) (Oral)  Resp 13  Ht 5' 5" (1.651 m)  Wt 194 lb 14.2 oz (88.4 kg)  BMI 32.43 kg/m2  SpO2 97%  LMP 08/10/2012  General: Vital signs reviewed and noted. Well-developed, well-nourished, in no acute distress; alert, appropriate and cooperative throughout examination.  Head: Normocephalic, atraumatic, sclera anicteric, mucus membranes are moist  Neck: Supple. Negative for carotid bruits. JVD not elevated.  Lungs:  Clear bilaterally to auscultation without wheezes, rales, or rhonchi.   Heart: RRR with S1 S2. No murmurs, rubs, or gallops appreciated.  Abdomen:  Soft, non-tender, non-distended with normoactive bowel sounds. No hepatomegaly. No rebound/guarding. No obvious abdominal masses  MSK: Strength and the appear normal for age.  Extremities: No clubbing or cyanosis. No edema.  Distal pedal pulses are 2+ and equal bilaterally.  Neurologic: Alert and oriented X 3. Moves all extremities spontaneously.  Psych: Responds to questions appropriately with a normal affect.    Lab results: Basic Metabolic Panel:  Recent Labs Lab 08/12/12 1308 08/12/12 2202  NA 140  --   K 3.7  --   CL 103  --   CO2 28  --   GLUCOSE 121*  --   BUN 13  --   CREATININE 0.77  --   CALCIUM 9.0  --   MG  --  1.9    CBC:  Recent Labs Lab 08/12/12 1308 08/13/12 0425  WBC 6.1 7.1  HGB 15.6* 15.5*  HCT 44.5 43.5  MCV 86.2 86.8  PLT 167 185    Cardiac Enzymes:  Recent Labs Lab 08/12/12 1440 08/12/12 2202 08/13/12 0425  TROPONINI 1.00* 0.99* 0.96*    BNP: No components found with this basename: POCBNP,   CBG: No results found for this basename: GLUCAP,  in the last 168 hours  Coagulation Studies:  Recent Labs  08/13/12 0425  LABPROT 13.6  INR 1.05     Other results: EKG :  NSR, no ST or T wave changes    Imaging: Dg Chest 2 View  08/12/2012  *RADIOLOGY REPORT*  Clinical Data:  Chest pain  CHEST - 2 VIEW  Comparison: None.  Findings: The heart and pulmonary vascularity are within normal limits.  The lungs are clear bilaterally.  No acute bony abnormality is seen.  IMPRESSION: No acute abnormality noted.   Original Report Authenticated By: Mark Lukens, M.D.       Assessment & Plan: 1. Chest pain:  She had a fairly serious episode of CP earlier today.  Sharp, severe CP for 5 minutes followed by 15-20 minutes of a dull, tight pain.  Her Troponin levels are 1.0  Her ECG is normal.   She is scheduled for cardiac cath today.   Will give clear liquids since her cath is at noon   Kyana Aicher J. Rebie Peale, Jr., MD, FACC 08/13/2012, 6:56 AM      

## 2012-08-13 NOTE — Progress Notes (Signed)
ANTICOAGULATION CONSULT NOTE - Follow Up Consult  Pharmacy Consult for heparin Indication: chest pain/ACS  No Known Allergies  Patient Measurements: Height: 5\' 5"  (165.1 cm) Weight: 194 lb 14.2 oz (88.4 kg) IBW/kg (Calculated) : 57 Heparin Dosing Weight: 85kg  Vital Signs: Temp: 98.2 F (36.8 C) (04/08 0730) Temp src: Oral (04/08 0730) BP: 124/79 mmHg (04/08 1000) Pulse Rate: 72 (04/08 1000)  Labs:  Recent Labs  08/12/12 1308  08/12/12 2202 08/13/12 0425 08/13/12 0956  HGB 15.6*  --   --  15.5*  --   HCT 44.5  --   --  43.5  --   PLT 167  --   --  185  --   LABPROT  --   --   --  13.6  --   INR  --   --   --  1.05  --   HEPARINUNFRC  --   --   --  0.39 0.32  CREATININE 0.77  --   --   --   --   TROPONINI  --   < > 0.99* 0.96* 0.47*  < > = values in this interval not displayed.  Estimated Creatinine Clearance: 103.7 ml/min (by C-G formula based on Cr of 0.77).   Medical History: Past Medical History  Diagnosis Date  . Asthma   . Nearsightedness     wears glasses sometimes  . Congenital hearing disorder   . Scoliosis     mild    Medications:  None pta  Assessment: 42 year old with chest pain earlier today and now with positive cardiac enzymes on IV heparin and plan for cath at noon today.  CBC/Pltc stable, no bleeding or complications noted.  Heparin level (0.32) is at-goal on 1200 units/hr.   Goal of Therapy:  Heparin level 0.3-0.7 units/ml Monitor platelets by anticoagulation protocol: Yes   Plan:  1. Continue IV heparin at 1200 units/hr.  2. F/U plans after cath lab.  Tad Moore, BCPS  Clinical Pharmacist Pager [phone]  08/13/2012 11:36 AM

## 2012-08-13 NOTE — H&P (View-Only) (Signed)
CONSULT NOTE  Date: 08/13/2012               Patient Name:  Stacey Garner MRN: [id number]  DOB: 1971/04/18 Age / Sex: 42 y.o., female        PCP: Ernst Breach Primary Cardiologist: New to [place]            Referring Physician: Donnetta Hutching, MD              Reason for Consult: Chest pain. + troponin           History of Present Illness: Patient is a 42 y.o. female with no significant PMH, who was admitted to [facility] on 08/12/2012 for evaluation of chest pain  She was driving back to school after a conference.  She developed a sharp CP, radiated to her left arm.  This severe She got out of her car, and had still had a dull tightness in her chest.   The pain radiated to her back.   The pain gradually eased after 15 minutes.  A co-worker at her school told her to go to the ER.   No pleuretic cp, no nausea, vomitting, .no dyspnea She does not get any regular exercise.  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  She is feeling better this am. Troponin levels remain mildly elevated.  Medications: Outpatient medications: none No prescriptions prior to admission    Current medications: Current Facility-Administered Medications  Medication Dose Route Frequency Provider Last Rate Last Dose  . 0.9 %  sodium chloride infusion  250 mL Intravenous PRN Ok Anis, NP      . 0.9 %  sodium chloride infusion  250 mL Intravenous PRN Vesta Mixer, MD 10 mL/hr at 08/12/12 2233 10 mL/hr at 08/12/12 2233  . 0.9 %  sodium chloride infusion   Intravenous Continuous Vesta Mixer, MD 150 mL/hr at 08/13/12 0605 150 mL/hr at 08/13/12 0605  . acetaminophen (TYLENOL) tablet 650 mg  650 mg Oral Q4H PRN Ok Anis, NP      . ALPRAZolam Prudy Feeler) tablet 0.25 mg  0.25 mg Oral BID PRN Ok Anis, NP   0.25 mg at 08/12/12 2225  . aspirin EC tablet 81 mg  81 mg Oral Daily  Ok Anis, NP      . atorvastatin (LIPITOR) tablet 80 mg  80 mg Oral q1800 Ok Anis, NP      . heparin ADULT infusion 100 units/mL (25000 units/250 mL)  1,200 Units/hr Intravenous Continuous Severiano Gilbert, [facility] 12 mL/hr at 08/12/12 2146 1,200 Units/hr at 08/12/12 2146  . metoprolol tartrate (LOPRESSOR) tablet 12.5 mg  12.5 mg Oral BID Ok Anis, NP   12.5 mg at 08/12/12 2222  . nitroGLYCERIN (NITROSTAT) SL tablet 0.4 mg  0.4 mg Sublingual Q5 Min x 3 PRN Ok Anis, NP   0.4 mg at 08/12/12 2106  . ondansetron (ZOFRAN) injection 4 mg  4 mg Intravenous Q6H PRN Ok Anis, NP      . sodium chloride 0.9 % injection 3 mL  3 mL Intravenous Q12H Ok Anis, NP      . sodium chloride 0.9 % injection 3 mL  3 mL Intravenous PRN Ok Anis, NP      . sodium chloride 0.9 % injection 3 mL  3 mL  Intravenous Q12H Vesta Mixer, MD   3 mL at 08/12/12 2223  . sodium chloride 0.9 % injection 3 mL  3 mL Intravenous PRN Vesta Mixer, MD      . zolpidem [facility]) tablet 5 mg  5 mg Oral QHS PRN Ok Anis, NP         No Known Allergies   Past Medical History  Diagnosis Date  . Asthma   . Nearsightedness     wears glasses sometimes  . Congenital hearing disorder   . Scoliosis     mild    Past Surgical History  Procedure Laterality Date  . Tonsillectomy    . Wisdom tooth extraction    . Leep  1995    Family History  Problem Relation Age of Onset  . Osteoporosis Mother   . Scoliosis Mother   . Alcohol abuse Father     died of ETOH abuse  . Cancer Father     possible pancreatic  cancer  . Thyroid disease Maternal Aunt   . Stroke Paternal Uncle   . Cancer Maternal Grandmother 70    ovarian  . Heart disease Paternal Grandmother   . Diabetes Neg Hx     Social History:  reports that she quit smoking about 14 years ago. She does not have any smokeless tobacco history on file. She reports that she does not drink  alcohol or use illicit drugs.    Physical Exam: BP 106/76  Pulse 62  Temp(Src) 98.7 F (37.1 C) (Oral)  Resp 13  Ht 5\' 5"  (1.651 m)  Wt 194 lb 14.2 oz (88.4 kg)  BMI 32.43 kg/m2  SpO2 97%  LMP 08/10/2012  General: Vital signs reviewed and noted. Well-developed, well-nourished, in no acute distress; alert, appropriate and cooperative throughout examination.  Head: Normocephalic, atraumatic, sclera anicteric, mucus membranes are moist  Neck: Supple. Negative for carotid bruits. JVD not elevated.  Lungs:  Clear bilaterally to auscultation without wheezes, rales, or rhonchi.   Heart: RRR with S1 S2. No murmurs, rubs, or gallops appreciated.  Abdomen:  Soft, non-tender, non-distended with normoactive bowel sounds. No hepatomegaly. No rebound/guarding. No obvious abdominal masses  MSK: Strength and the appear normal for age.  Extremities: No clubbing or cyanosis. No edema.  Distal pedal pulses are 2+ and equal bilaterally.  Neurologic: Alert and oriented X 3. Moves all extremities spontaneously.  Psych: Responds to questions appropriately with a normal affect.    Lab results: Basic Metabolic Panel:  Recent Labs Lab 08/12/12 1308 08/12/12 2202  NA 140  --   K 3.7  --   CL 103  --   CO2 28  --   GLUCOSE 121*  --   BUN 13  --   CREATININE 0.77  --   CALCIUM 9.0  --   MG  --  1.9    CBC:  Recent Labs Lab 08/12/12 1308 08/13/12 0425  WBC 6.1 7.1  HGB 15.6* 15.5*  HCT 44.5 43.5  MCV 86.2 86.8  PLT 167 185    Cardiac Enzymes:  Recent Labs Lab 08/12/12 1440 08/12/12 2202 08/13/12 0425  TROPONINI 1.00* 0.99* 0.96*    BNP: No components found with this basename: POCBNP,   CBG: No results found for this basename: GLUCAP,  in the last 168 hours  Coagulation Studies:  Recent Labs  08/13/12 0425  LABPROT 13.6  INR 1.05     Other results: EKG :  NSR, no ST or T wave changes  Imaging: Dg Chest 2 View  08/12/2012  *RADIOLOGY REPORT*  Clinical Data:  Chest pain  CHEST - 2 VIEW  Comparison: None.  Findings: The heart and pulmonary vascularity are within normal limits.  The lungs are clear bilaterally.  No acute bony abnormality is seen.  IMPRESSION: No acute abnormality noted.   Original Report Authenticated By: Alcide Clever, M.D.       Assessment & Plan: 1. Chest pain:  She had a fairly serious episode of CP earlier today.  Sharp, severe CP for 5 minutes followed by 15-20 minutes of a dull, tight pain.  Her Troponin levels are 1.0  Her ECG is normal.   She is scheduled for cardiac cath today.   Will give clear liquids since her cath is at noon   Vesta Mixer, Montez Hageman., MD, [facility] 08/13/2012, 6:56 AM

## 2012-08-14 ENCOUNTER — Encounter (HOSPITAL_COMMUNITY): Payer: Self-pay | Admitting: Physician Assistant

## 2012-08-14 DIAGNOSIS — E785 Hyperlipidemia, unspecified: Secondary | ICD-10-CM

## 2012-08-14 DIAGNOSIS — I2542 Coronary artery dissection: Secondary | ICD-10-CM

## 2012-08-14 DIAGNOSIS — I2510 Atherosclerotic heart disease of native coronary artery without angina pectoris: Secondary | ICD-10-CM

## 2012-08-14 DIAGNOSIS — I214 Non-ST elevation (NSTEMI) myocardial infarction: Secondary | ICD-10-CM

## 2012-08-14 LAB — CBC
Hemoglobin: 14.5 g/dL (ref 12.0–15.0)
Platelets: 174 10*3/uL (ref 150–400)
RBC: 4.75 MIL/uL (ref 3.87–5.11)
WBC: 5.7 10*3/uL (ref 4.0–10.5)

## 2012-08-14 MED ORDER — NITROGLYCERIN 0.4 MG SL SUBL: 0.4000 mg | SUBLINGUAL_TABLET | SUBLINGUAL | Status: DC | PRN

## 2012-08-14 MED ORDER — METOPROLOL TARTRATE 12.5 MG HALF TABLET: 12.5000 mg | ORAL_TABLET | Freq: Two times a day (BID) | ORAL | Status: DC

## 2012-08-14 MED ORDER — ASPIRIN 81 MG PO TBEC: 81.0000 mg | DELAYED_RELEASE_TABLET | Freq: Every day | ORAL | Status: DC

## 2012-08-14 MED ORDER — ATORVASTATIN CALCIUM 80 MG PO TABS: 80.0000 mg | ORAL_TABLET | Freq: Every day | ORAL | Status: DC

## 2012-08-14 NOTE — Progress Notes (Signed)
D/c teaching done, pt refused w/c to car.  Pt escorted with family.   Eliane Decree, RN

## 2012-08-14 NOTE — Progress Notes (Addendum)
CARDIAC REHAB PHASE I   PRE:  Rate/Rhythm: 67 SR    BP: sitting 121/85    SaO2:   MODE:  Ambulation: 350 ft   POST:  Rate/Rhythm: 96 SR    BP: sitting 122/82     SaO2:   Pt sts she doesn't feel herself. SOB, can't take deep breath. Also feels weak. Able to walk which she sts she felt a little better walking than lying in bed trying to talk on phone. VSS. Ed completed. Pt has been eating a vegan diet for 6 months but only eating once a day. Encouraged breakfast and lunch. Sounds like she does consume coconut products. Cholesterol panel has changed dramatically since November, LDL much higher now. Discussed with pt stress relief and having balance in life. Interested in CRPII if her work will allow. Will send referral. No gym until she sees MD. Walking instructions given. At end of education pt began feeling nauseated and c/o HA. BP 117/78. Notified RN. [phone]  Stacey Garner CES, ACSM 08/14/2012 10:35 AM

## 2012-08-14 NOTE — Progress Notes (Addendum)
CONSULT NOTE  Date: 08/14/2012               Patient Name:  Stacey Garner MRN: [id number]  DOB: May 17, 1970 Age / Sex: 42 y.o., female        PCP: Ernst Breach Primary Cardiologist: New to [name]            Referring Physician: Donnetta Hutching, MD              Reason for Consult: Chest pain. + troponin           History of Present Illness: Patient is a 42 y.o. female with no significant PMH, who was admitted to [facility] on 08/12/2012 for evaluation of chest pain  She was driving back to school after a conference.  She developed a sharp CP, radiated to her left arm.  This severe She got out of her car, and had still had a dull tightness in her chest.   The pain radiated to her back.   The pain gradually eased after 15 minutes.  A co-worker at her school told her to go to the ER.   No pleuretic cp, no nausea, vomitting, .no dyspnea She does not get any regular exercise.  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  She is feeling better this am. Troponin levels remain mildly elevated.  Cath revealed a distal OM dissection.   Medications: Outpatient medications: none No prescriptions prior to admission    Current medications: Current Facility-Administered Medications  Medication Dose Route Frequency Provider Last Rate Last Dose  . 0.9 %  sodium chloride infusion  250 mL Intravenous PRN Ok Anis, NP      . acetaminophen (TYLENOL) tablet 650 mg  650 mg Oral Q4H PRN Ok Anis, NP   650 mg at 08/13/12 1857  . ALPRAZolam Prudy Feeler) tablet 0.25 mg  0.25 mg Oral BID PRN Ok Anis, NP   0.25 mg at 08/12/12 2225  . aspirin EC tablet 81 mg  81 mg Oral Daily Ok Anis, NP      . atorvastatin (LIPITOR) tablet 80 mg  80 mg Oral q1800 Ok Anis, NP   80 mg at 08/13/12 1857  . metoprolol tartrate (LOPRESSOR) tablet 12.5 mg  12.5 mg Oral BID  Ok Anis, NP   12.5 mg at 08/13/12 2152  . nitroGLYCERIN (NITROSTAT) SL tablet 0.4 mg  0.4 mg Sublingual Q5 Min x 3 PRN Ok Anis, NP   0.4 mg at 08/12/12 2106  . ondansetron (ZOFRAN) injection 4 mg  4 mg Intravenous Q6H PRN Ok Anis, NP      . sodium chloride 0.9 % injection 3 mL  3 mL Intravenous Q12H Ok Anis, NP   3 mL at 08/13/12 2200  . sodium chloride 0.9 % injection 3 mL  3 mL Intravenous PRN Ok Anis, NP      . zolpidem (AMBIEN) tablet 5 mg  5 mg Oral QHS PRN Ok Anis, NP         No Known Allergies   Past Medical History  Diagnosis Date  . Asthma   . Nearsightedness     wears glasses sometimes  . Congenital hearing disorder   . Scoliosis     mild    Past Surgical History  Procedure Laterality Date  . Tonsillectomy    .  Wisdom tooth extraction    . Leep  1995    Family History  Problem Relation Age of Onset  . Osteoporosis Mother   . Scoliosis Mother   . Alcohol abuse Father     died of ETOH abuse  . Cancer Father     possible pancreatic  cancer  . Thyroid disease Maternal Aunt   . Stroke Paternal Uncle   . Cancer Maternal Grandmother 70    ovarian  . Heart disease Paternal Grandmother   . Diabetes Neg Hx     Social History:  reports that she quit smoking about 14 years ago. She does not have any smokeless tobacco history on file. She reports that she does not drink alcohol or use illicit drugs.    Physical Exam: BP 131/79  Pulse 57  Temp(Src) 98.7 F (37.1 C) (Oral)  Resp 18  Ht 5\' 5"  (1.651 m)  Wt 204 lb 12.9 oz (92.9 kg)  BMI 34.08 kg/m2  SpO2 98%  LMP 08/10/2012  General: Vital signs reviewed and noted. Well-developed, well-nourished, in no acute distress; alert, appropriate and cooperative throughout examination.  Head: Normocephalic, atraumatic, sclera anicteric, mucus membranes are moist  Neck: Supple. Negative for carotid bruits. JVD not elevated.  Lungs:  Clear bilaterally  to auscultation without wheezes, rales, or rhonchi.   Heart: RRR with S1 S2. No murmurs, rubs, or gallops appreciated.  Abdomen:  Soft, non-tender, non-distended with normoactive bowel sounds. No hepatomegaly. No rebound/guarding. No obvious abdominal masses  MSK: Strength and the appear normal for age.  Extremities: Right radial cath site looks good.  Neurologic: Alert and oriented X 3. Moves all extremities spontaneously.  Psych: Responds to questions appropriately with a normal affect.    Lab results: Basic Metabolic Panel:  Recent Labs Lab 08/12/12 1308 08/12/12 2202  NA 140  --   K 3.7  --   CL 103  --   CO2 28  --   GLUCOSE 121*  --   BUN 13  --   CREATININE 0.77  --   CALCIUM 9.0  --   MG  --  1.9    CBC:  Recent Labs Lab 08/12/12 1308 08/13/12 0425 08/14/12 0438  WBC 6.1 7.1 5.7  HGB 15.6* 15.5* 14.5  HCT 44.5 43.5 41.5  MCV 86.2 86.8 87.4  PLT 167 185 174    Cardiac Enzymes:  Recent Labs Lab 08/12/12 1440 08/12/12 2202 08/13/12 0425 08/13/12 0956  TROPONINI 1.00* 0.99* 0.96* 0.47*    BNP: No components found with this basename: POCBNP,   CBG: No results found for this basename: GLUCAP,  in the last 168 hours  Coagulation Studies:  Recent Labs  08/13/12 0425  LABPROT 13.6  INR 1.05     Other results: EKG :  NSR, no ST or T wave changes  Imaging: Dg Chest 2 View  08/12/2012  *RADIOLOGY REPORT*  Clinical Data: Chest pain  CHEST - 2 VIEW  Comparison: None.  Findings: The heart and pulmonary vascularity are within normal limits.  The lungs are clear bilaterally.  No acute bony abnormality is seen.  IMPRESSION: No acute abnormality noted.   Original Report Authenticated By: Alcide Clever, M.D.       Assessment & Plan: 1. 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   Appears to have had a spontaneous dissection of her OM.  OK to go home.  Ambulate prior to DC.  Metoprolol 12. 5 bid NTG Atorvastatin 80  daily ASA 81  She may return to work on Monday , April 14.   2. Hyperlipidemia:  LDL is 169 - goal is 70 or below.  Encouraged her to take the statin ( although she does not want to)   return to see me or Lawson Fiscal in 3-4 weeks and then in 1-2 months.      Vesta Mixer, Montez Hageman., MD, [facility] 08/14/2012, 7:41 AM

## 2012-08-14 NOTE — Discharge Summary (Signed)
Discharge Summary   Patient ID: Stacey Garner,  MRN: [id number], DOB/AGE: 14-Jun-1970 42 y.o.  Admit date: 08/12/2012 Discharge date: 08/14/2012  Primary Physician: Ernst Breach, PA-C Primary Cardiologist: New- P. Chayse Gracey, MD   Discharge Diagnoses Principal Problem:   NSTEMI (non-ST elevated myocardial infarction)  - s/p cardiac cath 08/13/12: 50-60% prox-mid OM1 narrowing and appearance c/w coronary dissection; LVEF 55-65% Active Problems:   Coronary artery dissection  - To OM1 as noted above   CAD (coronary artery disease), native coronary artery  - Single vessel OM1, nonobstructive  - To be discharged on low-dose ASA, BB, statin, NTG SL PRN   Hyperlipidemia    - LDL 169, HDL 43, TG 96, TC 231  Allergies No Known Allergies  Diagnostic Studies/Procedures  PA/LATERAL CHEST X-RAY - 08/12/12  IMPRESSION:  No acute abnormality noted.  CARDIAC CATHETERIZATION - 08/13/12  Hemodynamics:  AO 106/65 mean 84 mm Hg  LV 112/11 mm Hg   Coronary angiography:  Coronary dominance: right  Left mainstem: Normal  Left anterior descending (LAD): Normal.  Left circumflex (LCx): The left circumflex gives rise to 2 marginal branches. The first marginal branch is diffusely narrowed from the proximal to mid vessel to 50-60%. There is occlusion of a small distal branch with dye staining. The second OM is normal.  Right coronary artery (RCA): Normal.  Left ventriculography: Left ventricular systolic function is normal, LVEF is estimated at 55-65%, there is no significant mitral regurgitation   Final Conclusions:  1. Single vessel coronary artery disease. 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  2. Normal LV function.  History of Present Illness  Stacey Garner is a 42 y.o. female who was admitted to [facility] on 08/12/12 with the above problem list.  She has no prior medical history. She was driving back to school after a conference when  she developed sharp, severe chest pain radiating to her left arm. She stopped her car and got up to rest but still space a dull tightness radiating to her back which eventually [place] after 15 minutes. She was advised to present to the emergency room by coworker at her school. She denied associated symptoms. 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 No prior history of GERD.  There, EKG revealed normal sinus rhythm with no evidence of ischemic changes. Initial troponin I did return mildly elevated. Basic metabolic panel, CBC and pro BNP returned unremarkable. She was given full dose aspirin. Her chest discomfort was somewhat atypical, given her mild troponin elevation she was hospitalized for further evaluation.  Hospital Course   She was heparinized and cardiac biomarkers were cycled overnight. Troponin I remained mildly elevated. Lipid panel as above returned revealing hyperlipidemia as noted above. Magnesium and TSH returned within normal limits. She remained stable overnight and plan was made to pursue cardiac catheterization in the morning.  She was informed, consented and prepped for the procedure which was accessed via the right radial artery. As above, this revealed single-vessel coronary artery disease to the proximal mid OM 1 and appearance of coronary dissection in the same artery. Left ventricular function was well-preserved. She tolerated the procedure well, without complications and the recommendation was made to pursue medical management. She was observed overnight and symptoms improved.   She was evaluated by Dr. Elease Hashimoto this AM and deemed stable for discharge after ambulating with cardiac rehab. She will be discharged on low-dose ASA, BB, statin and  NTG SL PRN. She will follow-up in < 7 days given her NSTEMI status. She will also follow-up with Dr. Elease Hashimoto in 1-2 months. LFTs and lipid panel will need to be drawn in 6 weeks since the initiation of  a statin. This information, including post-cath instructions and activity restrictions, has been clearly outlined in the discharge AVS.   Discharge Vitals:  Blood pressure 131/79, pulse 57, temperature 98.7 F (37.1 C), temperature source Oral, resp. rate 18, height 5\' 5"  (1.651 m), weight 92.9 kg (204 lb 12.9 oz), last menstrual period 08/10/2012, SpO2 98.00%.   Labs: Recent Labs     08/13/12  0425  08/14/12  0438  WBC  7.1  5.7  HGB  15.5*  14.5  HCT  43.5  41.5  MCV  86.8  87.4  PLT  185  174   Recent Labs     08/12/12  2202  DDIMER  0.47    Recent Labs Lab 08/12/12 1308  NA 140  K 3.7  CL 103  CO2 28  BUN 13  CREATININE 0.77  CALCIUM 9.0  GLUCOSE 121*   Recent Labs     08/12/12  2202  08/13/12  0425  08/13/12  0956  TROPONINI  0.99*  0.96*  0.47*   Recent Labs     08/13/12  0500  CHOL  231*  HDL  43  LDLCALC  169*  TRIG  96  CHOLHDL  5.4    Recent Labs  08/12/12 2202  TSH 3.010    Disposition:  Discharge Orders   Future Appointments Provider Department Dept Phone   08/16/2012 11:10 AM Beatrice Lecher, PA-C [employer] Main [facility] [place]) [phone]   11/01/2012 10:00 AM Vesta Mixer, MD [place] [facility] Main Office [place]) [phone]   Future Orders Complete By Expires     Diet - low sodium heart healthy  As directed     Increase activity slowly  As directed           Follow-up Information   Follow up with Tereso Newcomer, PA-C On 08/16/2012. (At 11:10 AM for follow-up)    Contact information:   [address] [place] [address] [phone]       Follow up with Elyn Aquas., MD On 11/01/2012. (At 10:00 AM for follow-up)    Contact information:   [address] [place] [address] [phone]       Follow up with Ernst Breach, PA-C. Schedule an appointment as soon as possible for a visit in 2 weeks. (For post-hospital follow-up. )    Contact information:   [address] [place] [address] [phone]      Discharge Medications:    Medication List    TAKE these medications       aspirin 81 MG EC tablet  Take 1 tablet (81 mg total) by mouth daily.     atorvastatin 80 MG tablet  Commonly known as:  LIPITOR  Take 1 tablet (80 mg total) by mouth daily at 6 PM.     metoprolol tartrate 12.5 mg Tabs  Commonly known as:  LOPRESSOR  Take 0.5 tablets (12.5 mg total) by mouth 2 (two) times daily.     nitroGLYCERIN 0.4 MG SL tablet  Commonly known as:  NITROSTAT  Place 1 tablet (0.4 mg total) under the tongue every 5 (five) minutes x 3 doses as needed for chest pain.       Outstanding Labs/Studies: LFTs,  lipid panel in 6 weeks  Duration of Discharge Encounter: Greater than 30 minutes including physician time.  Signed, R. Hurman Horn, PA-C 08/14/2012, 9:04 AM  Attending Note:   The patient was seen and examined.  Agree with assessment and plan as noted above.  Changes made to the above note as needed.  see my note from earlier today.   Vesta Mixer, Montez Hageman., MD, [facility] 08/14/2012, 12:45 PM

## 2012-08-15 ENCOUNTER — Telehealth: Payer: Self-pay | Admitting: Cardiovascular Disease

## 2012-08-15 NOTE — Telephone Encounter (Signed)
**  TCM**; attempted to call patient.  Patient's mailbox is full and cannot receive messages.  LM for patient to call us on phone of Alvino Chapel @ [phone] who is listed as patient's emergency contact.

## 2012-08-16 ENCOUNTER — Encounter: Payer: Self-pay | Admitting: Physician Assistant

## 2012-08-16 ENCOUNTER — Ambulatory Visit (INDEPENDENT_AMBULATORY_CARE_PROVIDER_SITE_OTHER): Payer: BC Managed Care – PPO | Admitting: Physician Assistant

## 2012-08-16 VITALS — BP 106/68 | HR 80 | Ht 65.0 in | Wt 196.4 lb

## 2012-08-16 DIAGNOSIS — I2510 Atherosclerotic heart disease of native coronary artery without angina pectoris: Secondary | ICD-10-CM

## 2012-08-16 DIAGNOSIS — I2542 Coronary artery dissection: Secondary | ICD-10-CM

## 2012-08-16 DIAGNOSIS — E785 Hyperlipidemia, unspecified: Secondary | ICD-10-CM

## 2012-08-16 DIAGNOSIS — I214 Non-ST elevation (NSTEMI) myocardial infarction: Secondary | ICD-10-CM

## 2012-08-16 MED ORDER — ATORVASTATIN CALCIUM 80 MG PO TABS: 40.0000 mg | ORAL_TABLET | Freq: Every day | ORAL | Status: DC

## 2012-08-16 NOTE — Progress Notes (Signed)
[address], [address] [place], [place]  [address] Phone: [phone] Fax:  [phone]  Date:  08/16/2012   ID:  Stacey Garner, DOB 05/17/1970, MRN [id number]  PCP:  Ernst Breach, PA-C  Primary Cardiologist:  Dr. Delane Ginger     History of Present Illness: Stacey Garner is a 42 y.o. female who returns for f/u after a recent admission to the hospital for a NSTEMI in the setting of acute spontaneous dissection of the OM1 coronary artery.  She was admitted 4/7-4/9 after presenting with sudden onset sharp chest pain radiating to her left arm and back.  Troponins were mildly elevated. LHC 08/13/12:  prox to mid OM1 50-60% (appearance c/w spontaneous dissection), small dist branch occluded, EF 55-65%.  Medical Rx was recommended with beta blocker, statin, ASA.  Since discharge, she is doing well. She has felt somewhat saddened by her recent MI. She does have chest tightness with this. Otherwise, she denies any further chest pain reminiscent of her previous angina. She denies dyspnea, orthopnea, PND, edema, syncope.  Labs (4/14):  K 3.7, Cr 0.77, pk Trop 1.00, LDL 169, Hgb 14.5, TSH 3.010  Wt Readings from Last 3 Encounters:  08/14/12 204 lb 12.9 oz (92.9 kg)  08/14/12 204 lb 12.9 oz (92.9 kg)  02/23/12 195 lb (88.451 kg)     Past Medical History  Diagnosis Date  . Asthma   . Nearsightedness     wears glasses sometimes  . Congenital hearing disorder   . Scoliosis     mild  . 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 Hyperlipidemia   . CAD (coronary artery disease) 08/13/2012    One-vessel CAD (50-60% OM1)    Current Outpatient Prescriptions  Medication Sig Dispense Refill  . aspirin EC 81 MG EC tablet Take 1 tablet (81 mg total) by mouth daily.      Marland Kitchen atorvastatin (LIPITOR) 80 MG tablet Take 1 tablet (80 mg total) by mouth daily at 6 PM.  30 tablet  3  . metoprolol tartrate (LOPRESSOR) 12.5 mg TABS Take 0.5 tablets (12.5 mg total) by  mouth 2 (two) times daily.  30 tablet  2  . nitroGLYCERIN (NITROSTAT) 0.4 MG SL tablet Place 1 tablet (0.4 mg total) under the tongue every 5 (five) minutes x 3 doses as needed for chest pain.  25 tablet  3   No current facility-administered medications for this visit.    Allergies:   No Known Allergies  Social History:  The patient  reports that she quit smoking about 14 years ago. She does not have any smokeless tobacco history on file. She reports that she does not drink alcohol or use illicit drugs.   ROS:  Please see the history of present illness.      All other systems reviewed and negative.   PHYSICAL EXAM: VS:  BP 106/68  Pulse 80  Ht 5\' 5"  (1.651 m)  Wt 196 lb 6.4 oz (89.086 kg)  BMI 32.68 kg/m2  SpO2 99%  LMP 08/10/2012 Well nourished, well developed, in no acute distress HEENT: normal Neck: no JVD Cardiac:  normal S1, S2; RRR; no murmur Lungs:  clear to auscultation bilaterally, no wheezing, rhonchi or rales Abd: soft, nontender, no hepatomegaly Ext: no edema; right wrist without hematoma or bruit  Skin: warm and dry Neuro:  CNs 2-12 intact, no focal abnormalities noted  EKG:  NSR, HR 71, normal axis, nonspecific ST-T wave changes  ASSESSMENT AND PLAN:  1. S/p NSTEMI in the setting of Spontaneous Coronary Artery Dissection:  Doing well. She does not think that cardiac rehab will fit into her schedule.  I have encouraged her to increase walking on her own.   2. CAD:  Continue ASA and statin.   3. Hyperlipidemia:  Continue statin.  Check Lipids and LFTs in 6 weeks.  4. Sadness:  She can d/w her PCP further.  She may look into counseling to see if this helps.  We discussed how this is a normal reaction after a heart attack.  5. Disposition:  She prefers to f/u with Dr. Peter Swaziland.  F/u with Dr. Swaziland in 6 weeks.    Signed, Tereso Newcomer, PA-C  11:22 AM 08/16/2012

## 2012-08-16 NOTE — Patient Instructions (Addendum)
DECREASE LIPITOR 40 MG AT NIGHT  Your physician recommends that you return for lab work in: 10/04/12 FASTING LIPID AND LIVER PANEL  PLEASE KEEP YOUR FOLLOW UP APPT WITH DR. Swaziland 6-8 WEEKS  CALL IF YOU CONTINUE TO HAVE SIDE EFFECTS ON LOWER DOSE OF LIPITOR

## 2012-08-28 ENCOUNTER — Ambulatory Visit (INDEPENDENT_AMBULATORY_CARE_PROVIDER_SITE_OTHER): Payer: BC Managed Care – PPO | Admitting: Medical

## 2012-08-28 ENCOUNTER — Encounter: Payer: Self-pay | Admitting: Medical

## 2012-08-28 VITALS — BP 112/80 | HR 75 | Temp 98.4°F | Resp 16 | Wt 194.0 lb

## 2012-08-28 DIAGNOSIS — I2542 Coronary artery dissection: Secondary | ICD-10-CM

## 2012-08-28 DIAGNOSIS — R1011 Right upper quadrant pain: Secondary | ICD-10-CM

## 2012-08-28 DIAGNOSIS — I2510 Atherosclerotic heart disease of native coronary artery without angina pectoris: Secondary | ICD-10-CM

## 2012-08-28 MED ORDER — OMEPRAZOLE 20 MG PO CPDR: DELAYED_RELEASE_CAPSULE | ORAL | Status: DC

## 2012-08-28 NOTE — Progress Notes (Signed)
Subjective: Here for hospital f/u.  Was seen by me for physical back in October 2013 with no real concerns or obvious cardiac risks, but went to the ED recently with sudden onset of tearing chest pain radiating to left neck and arm.   Was seen by cardiology who was concerned about dissection, ended up having catheterization, and dissection of coronary artery was diagnosed along with NSTEMI.  She was advised that the dissection would resolve on its on.  Was started on lipid medication, aspirin, as preventative measures.  She notes that her lipid panel in the hospital was NON fasting.   She ended up seeing 2 different cardiologist due to change of shifts at that time, and states she is confused as she got 2 different descriptions of the events that took place.   She even feels as though one of the cardiologist was suggesting that she was seeking to be taken out of work and placed on disability, although she says she never mentioned being taken out of work.    She is frustrated and scared given no prior cardiac risks, she is worried about future cardiac events, and she understands she has cardiac f/u but doesn't want to go back to [facility] cardiology.  Would like another review and opinion at different cardiology such as [facility].  No other new c/o.  She is taking her current medications that were started in the ED.  She does note some recent RUA abdominal pain.   No association with food, occasional nausea.  No hx/o gall bladder or liver issues, no pancreas issues, doesn't drink alcohol regularly or recently, but she is worried that this may be associated with the recent cardiac issues.  She does eat spicy and citrus foods.    Cardiology advised she f/u here to discussed her mood and sadness since the new cardiac diagnosis.  However, she notes that she is handling things fine except when she has to think about rechecking with cardiology here.  Was really frustrated with the hospitalization and way things were handled  or presented to her.     Objective: Filed Vitals:   08/28/12 0842  BP: 112/80  Pulse: 75  Temp: 98.4 F (36.9 C)  Resp: 16    General appearance: alert, no distress, WD/WN, white female, crying at times Neck: supple, no lymphadenopathy, no thyromegaly, no masses, no JVD or bruit Heart: RRR, normal S1, S2, no murmurs Lungs: CTA bilaterally, no wheezes, rhonchi, or rales Abdomen: +bs, soft, non tender, non distended, no masses, no hepatomegaly, no splenomegaly, no bruits Back: nontender Pulses: 2+ symmetric, upper and lower extremities, normal cap refill Ext: no edema   Assessment: Encounter Diagnoses  Name Primary?  . Abdominal pain, right upper quadrant Yes  . Coronary artery dissection   . CAD (coronary artery disease)      Plan: Abdominal pain - possibly GERD, can't rule out other.   Labs today, consider abdominal US to rule out gall bladder issue.   Begin Omeprazole given her diet and recent stressors.  Advised trigger avoidance for GERD.    CAD, coronary artery dissection - reviewed hospital notes, cath info.  She certainly has questions as do I as to the cause and prevention.  I advised she c/t current medications as preventative measures.   We will refer to different cardiologist at her request for follow up.   Due for lipid recheck in 6wk. Will discuss case with supervising physician.  Follow-up pending labs,call back

## 2012-08-29 LAB — COMPREHENSIVE METABOLIC PANEL
ALT: 13 U/L (ref 0–35)
AST: 12 U/L (ref 0–37)
CO2: 27 mEq/L (ref 19–32)
Chloride: 104 mEq/L (ref 96–112)
Sodium: 139 mEq/L (ref 135–145)
Total Bilirubin: 0.9 mg/dL (ref 0.3–1.2)
Total Protein: 6.8 g/dL (ref 6.0–8.3)

## 2012-08-29 LAB — VITAMIN D 25 HYDROXY (VIT D DEFICIENCY, FRACTURES): Vit D, 25-Hydroxy: 17 ng/mL — ABNORMAL LOW (ref 30–89)

## 2012-09-03 ENCOUNTER — Telehealth: Payer: Self-pay | Admitting: Medical

## 2012-09-03 NOTE — Telephone Encounter (Signed)
Patient is aware of her appointment at [facility] on Sep 16, 2012 @ 145 pm with Dr. Gordy Councilman. [facility] [address] [place], [place] - [phone]

## 2012-09-03 NOTE — Telephone Encounter (Signed)
LMOM TO CB. CLS 

## 2012-10-03 ENCOUNTER — Telehealth: Payer: Self-pay | Admitting: Family Medicine

## 2012-10-03 ENCOUNTER — Telehealth: Payer: Self-pay | Admitting: Cardiology

## 2012-10-03 NOTE — Telephone Encounter (Signed)
Patient called no answer.Left message on personal voice mail appointment scheduled with Rick Duff PA tomorrow 10/04/12 at 9:00 am.Appointment with Norma Fredrickson NP 10/08/12 cancelled.Advised she can have lab work and see Brooke 10/04/12.

## 2012-10-03 NOTE — Telephone Encounter (Signed)
PT called and states she has a heart condition.  She has had strange pains and neck pain for 3 weeks.  I advised her to call her cardiologist.  Pt will do.If they cannot see her to let us know.

## 2012-10-03 NOTE — Telephone Encounter (Signed)
Patient stated she has been having pain in left neck and shoulder for the past 2 1/2 weeks and numbness in left arm.States left arm is weak.No chest pain,no sob.Patient wanting to be seen.Appointment scheduled with Norma Fredrickson NP 10/08/12.Advised to go to ER if needed.

## 2012-10-03 NOTE — Telephone Encounter (Signed)
New Prob     Pt states she has a strange feeling in neck and shoulder on the left side, feeling a numb feeling in left arm and hand, feels very tired as well. Would like to speak to nurse about getting in today or tomorrow. Please call.

## 2012-10-03 NOTE — Telephone Encounter (Signed)
Returned call to patient she stated she has been having pain in left neck and shoulder for 2 1/2 weeks

## 2012-10-04 ENCOUNTER — Encounter: Payer: Self-pay | Admitting: Cardiology

## 2012-10-04 ENCOUNTER — Ambulatory Visit (INDEPENDENT_AMBULATORY_CARE_PROVIDER_SITE_OTHER): Payer: BC Managed Care – PPO | Admitting: Cardiology

## 2012-10-04 ENCOUNTER — Other Ambulatory Visit: Payer: BC Managed Care – PPO

## 2012-10-04 VITALS — BP 90/72 | HR 53 | Ht 65.0 in | Wt 191.1 lb

## 2012-10-04 DIAGNOSIS — R200 Anesthesia of skin: Secondary | ICD-10-CM

## 2012-10-04 DIAGNOSIS — R209 Unspecified disturbances of skin sensation: Secondary | ICD-10-CM

## 2012-10-04 DIAGNOSIS — M542 Cervicalgia: Secondary | ICD-10-CM

## 2012-10-04 DIAGNOSIS — M25512 Pain in left shoulder: Secondary | ICD-10-CM

## 2012-10-04 DIAGNOSIS — R079 Chest pain, unspecified: Secondary | ICD-10-CM

## 2012-10-04 DIAGNOSIS — M25519 Pain in unspecified shoulder: Secondary | ICD-10-CM

## 2012-10-04 DIAGNOSIS — I2542 Coronary artery dissection: Secondary | ICD-10-CM

## 2012-10-04 NOTE — Patient Instructions (Addendum)
Have lab work today    Keep appointment with Dr.Jordan 10/16/12 at 9:30 AM

## 2012-10-04 NOTE — Telephone Encounter (Signed)
Patient has an appointment to see the cardiologists today 10/04/12 this morning at 930 am. CLS

## 2012-10-04 NOTE — Telephone Encounter (Signed)
What is status?  Does she have appt to see cardiology today?   May need to come in here today if no appt

## 2012-10-04 NOTE — Progress Notes (Signed)
ELECTROPHYSIOLOGY OFFICE NOTE  Patient ID: Stacey Garner MRN: [id number], DOB/AGE: Jan 08, 1971   Date of Visit: 10/04/2012  Primary Physician: Crosby Oyster, PA-C Primary Cardiologist: Peter Swaziland, MD Reason for Visit: Left neck/shoulder pain and left arm numbness  History of Present Illness  Stacey Garner is a 42 year old woman who presents for evaluation of left neck/shoulder pain and left arm numbness. Her PMH is significant for recent NSTEMI in the setting of acute spontaneous dissection of the OM-1. She was admitted 4/7-4/9 after presenting with sudden onset sharp chest pain radiating to her left arm and back. Her 12-lead ECG was unremarkable. Troponins were mildly elevated. LHC 08/13/12: prox to mid OM1 50-60% (appearance c/w spontaneous dissection), small dist branch occluded, EF 55-65%. Medical Rx was recommended with beta blocker, statin, ASA. She presents today and reports left neck/shoulder pain accompanied by left arm numbness x 2-3 weeks. This has been constant, never subsiding. She states it is not severe enough to limit her activities but "is just always there." Initially, she felt it was not similar to the pain she experienced back in early April; however, upon further questioning today, she reports the left neck and shoulder discomfort does feel somewhat similar. She denies chest pain or SOB. She denies palpitations, dizziness, near syncope or syncope. She denies recent illness, fever/chills or trauma to the neck, shoulder or arm. She denies alleviating or aggravating factors.   Past Medical History Past Medical History  Diagnosis Date  . Asthma   . Nearsightedness     wears glasses sometimes  . Congenital hearing disorder   . Scoliosis     mild  . 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 Hyperlipidemia   . CAD (coronary artery disease) 08/13/2012    One-vessel CAD (50-60% OM1)    Past Surgical History Past Surgical History  Procedure Laterality  Date  . Tonsillectomy    . Wisdom tooth extraction    . Leep  1995  . Cardiac catheterization  08/13/2012    50-60% prox-mid OM1 narrowing and appearance c/w coronary dissection; LVEF 55-65%    Allergies/Intolerances No Known Allergies  Current Home Medications Current Outpatient Prescriptions  Medication Sig Dispense Refill  . aspirin EC 81 MG EC tablet Take 1 tablet (81 mg total) by mouth daily.      Marland Kitchen atorvastatin (LIPITOR) 80 MG tablet Take 0.5 tablets (40 mg total) by mouth daily at 6 PM.      . co-enzyme Q-10 30 MG capsule Take 30 mg by mouth daily.      . fish oil-omega-3 fatty acids 1000 MG capsule Take 2 g by mouth daily.      . metoprolol tartrate (LOPRESSOR) 12.5 mg TABS Take 0.5 tablets (12.5 mg total) by mouth 2 (two) times daily.  30 tablet  2  . MISC NATURAL PRODUCTS PO Take by mouth.      . nitroGLYCERIN (NITROSTAT) 0.4 MG SL tablet Place 1 tablet (0.4 mg total) under the tongue every 5 (five) minutes x 3 doses as needed for chest pain.  25 tablet  3  . Nutritional Supplements (SYTRINOL PO) Take by mouth.      Marland Kitchen omeprazole (PRILOSEC) 20 MG capsule Take in the morning 30-60 minutes before breakfast daily  30 capsule  1   No current facility-administered medications for this visit.   Social History Social History  . Marital Status: Married   Occupational History  . social worker [employer]  Social History Main Topics  . Smoking status: Former Smoker    Quit date: 07/13/1998  . Smokeless tobacco: Not on file  . Alcohol Use: No  . Drug Use: No  . Sexually Active: Not on file   Social History Narrative   Buddhist, divorced, 2 daughters, age 89yo and 22yo, exercises 3 days per week 45 min each    Review of Systems General: No chills, fever, night sweats or weight changes Cardiovascular: No chest pain, dyspnea on exertion, edema, orthopnea, palpitations, paroxysmal nocturnal dyspnea Dermatological: No rash, lesions or masses Respiratory: No cough,  dyspnea Urologic: No hematuria, dysuria Abdominal: No nausea, vomiting, diarrhea, bright red blood per rectum, melena, or hematemesis Neurologic: No visual changes, weakness, changes in mental status All other systems reviewed and are otherwise negative except as noted above.  Physical Exam Blood pressure 90/72, pulse 53, height 5\' 5"  (1.651 m), weight 191 lb 2 oz (86.694 kg).  General: Well developed, well appearing 42 year old female in no acute distress. HEENT: Normocephalic, atraumatic. EOMs intact. Sclera nonicteric. Oropharynx clear.  Neck: Supple without bruits. No JVD. Lungs: Respirations regular and unlabored, CTA bilaterally. No wheezes, rales or rhonchi. Heart: RRR. S1, S2 present. No murmurs, rub, S3 or S4. Abdomen: Soft, non-distended.  Extremities: No clubbing, cyanosis or edema. PT/Radials 2+ and equal bilaterally. Psych: Normal affect. Neuro: Alert and oriented X 3. Moves all extremities spontaneously.   Diagnostics 12-lead ECG today shows sinus bradycardia at 53 bpm, no ST-T wave abnormalities  Assessment and Plan 1. Left neck/shoulder pain, left arm numbness 2. Recent NSTEMI in setting of acute spontaneous dissection of OM-1 [name] presents with symptoms somewhat similar to her previous anginal symptoms last month at which time she was found to have an acute spontaneous coronary artery dissection. Her 12-lead ECG shows no ST-T wave abnormalities. However, given her history and persistent symptoms, we recommended she be directly admitted to [facility] for observation and serial CEs. [name] refused to go to [facility]. She has agreed to labs today. We will check a troponin level now (she was already scheduled for lipid panel and LFTs). Again we explained that it is our recommendation that she go to [facility] now; however, she again refused but stated she would go to the ED if her symptoms worsened. She was instructed to keep her follow-up as scheduled with Dr. Swaziland in 1-2 weeks.  The  above plan of care was formulated with Dr. Clifton James who was in to interview and examine the patient with me.  Signed, Caeson Filippi, Nehemiah Settle, PA-C 10/04/2012, 1:50 PM

## 2012-10-08 ENCOUNTER — Ambulatory Visit: Payer: BC Managed Care – PPO | Admitting: Nurse Practitioner

## 2012-10-08 LAB — TROPONIN I

## 2012-10-16 ENCOUNTER — Other Ambulatory Visit: Payer: Self-pay

## 2012-10-16 ENCOUNTER — Encounter: Payer: Self-pay | Admitting: Cardiology

## 2012-10-16 ENCOUNTER — Ambulatory Visit (INDEPENDENT_AMBULATORY_CARE_PROVIDER_SITE_OTHER): Payer: BC Managed Care – PPO | Admitting: Cardiology

## 2012-10-16 VITALS — BP 124/82 | HR 76 | Ht 65.0 in | Wt 188.8 lb

## 2012-10-16 DIAGNOSIS — E785 Hyperlipidemia, unspecified: Secondary | ICD-10-CM

## 2012-10-16 DIAGNOSIS — I2510 Atherosclerotic heart disease of native coronary artery without angina pectoris: Secondary | ICD-10-CM

## 2012-10-16 DIAGNOSIS — I2542 Coronary artery dissection: Secondary | ICD-10-CM

## 2012-10-16 NOTE — Progress Notes (Signed)
Stacey Garner Date of Birth: 1971/01/21 Medical Record #[id number]  History of Present Illness: Stacey Garner seen today for followup. She is a pleasant 42 year old white female who was admitted in April with an acute spontaneous coronary dissection involving the first obtuse marginal vessel. Ejection fraction was normal. She had a mild elevation of her troponins. She was managed with low-dose beta blocker, aspirin, and statin therapy. She was seen about a week ago with complaints of pain in her left neck with some numbness in her left arm. This is clearly quite different than her previous anginal symptoms. ECG was normal. She is felt this discomfort for the past month. Initially it was constant but then it has been intermittent. It has not been bad enough for her to even take any analgesics. It seems to be improving. She is exercising regularly without difficulty. She has lost 8 pounds. She denies any chest pain or shortness of breath.  Current Outpatient Prescriptions on File Prior to Visit  Medication Sig Dispense Refill  . aspirin EC 81 MG EC tablet Take 1 tablet (81 mg total) by mouth daily.      Marland Kitchen atorvastatin (LIPITOR) 80 MG tablet Take 0.5 tablets (40 mg total) by mouth daily at 6 PM.      . co-enzyme Q-10 30 MG capsule Take 30 mg by mouth daily.      . fish oil-omega-3 fatty acids 1000 MG capsule Take 2 g by mouth daily.      . metoprolol tartrate (LOPRESSOR) 12.5 mg TABS Take 0.5 tablets (12.5 mg total) by mouth 2 (two) times daily.  30 tablet  2  . MISC NATURAL PRODUCTS PO Take by mouth.      . nitroGLYCERIN (NITROSTAT) 0.4 MG SL tablet Place 1 tablet (0.4 mg total) under the tongue every 5 (five) minutes x 3 doses as needed for chest pain.  25 tablet  3  . Nutritional Supplements (SYTRINOL PO) Take by mouth.       No current facility-administered medications on file prior to visit.    No Known Allergies  Past Medical History  Diagnosis Date  . Asthma   . Nearsightedness     wears  glasses sometimes  . Congenital hearing disorder   . Scoliosis     mild  . 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 Hyperlipidemia   . CAD (coronary artery disease) 08/13/2012    One-vessel CAD (50-60% OM1)    Past Surgical History  Procedure Laterality Date  . Tonsillectomy    . Wisdom tooth extraction    . Leep  1995  . Cardiac catheterization  08/13/2012    50-60% prox-mid OM1 narrowing and appearance c/w coronary dissection; LVEF 55-65%    History  Smoking status  . Former Smoker  . Quit date: 07/13/1998  Smokeless tobacco  . Not on file    History  Alcohol Use No    Family History  Problem Relation Age of Onset  . Osteoporosis Mother   . Scoliosis Mother   . Alcohol abuse Father     died of ETOH abuse  . Cancer Father     possible pancreatic  cancer  . Thyroid disease Maternal Aunt   . Stroke Paternal Uncle   . Cancer Maternal Grandmother 70    ovarian  . Heart disease Paternal Grandmother   . Diabetes Neg Hx     Review of Systems: The review of systems is positive for the fact  that she is currently moving.  All other systems were reviewed and are negative.  Physical Exam: BP 124/82  Pulse 76  Ht 5\' 5"  (1.651 m)  Wt 188 lb 12.8 oz (85.639 kg)  BMI 31.42 kg/m2  SpO2 98% She is a pleasant, young white female in no acute distress. HEENT: Normal. No JVD, adenopathy, thyromegaly, or bruits. She has full range of motion of her neck. Lungs: Clear Cardiovascular: Regular rate and rhythm. Normal S1 and S2. No gallop or murmur. Abdomen: Soft and nontender. Extremities: No edema. Pedal pulses are 2+. Skin: Warm and dry Neuro: Alert and oriented x3. Cranial nerves II through XII are intact.  LABORATORY DATA:   Assessment / Plan: 1. Atypical neck pain. I think this is a musculoskeletal pain and is clearly different than her coronary presentation. I recommended local heat and rest. May take over-the-counter analgesics as  needed. 2. History of spontaneous coronary dissection. No recurrent symptoms. We will continue with her very low dose of metoprolol and aspirin. I'll followup again in 6 months. If she remains pain-free at that time we could consider stopping metoprolol but she states she actually likes her metoprolol because it helps calm her. 3. Hyperlipidemia. Baseline LDL of 169. We'll repeat lipid panel and LFTs. Of note the patient had these labs drawn last week but there was some error in the lab collection. Since this was our error she should not be charged an additional co-pay and we need to make sure that she was not charge for previous lab draw.

## 2012-10-16 NOTE — Patient Instructions (Addendum)
We will check your lab work today  I will see you in 6 months.

## 2012-10-22 ENCOUNTER — Other Ambulatory Visit: Payer: BC Managed Care – PPO

## 2012-11-01 ENCOUNTER — Ambulatory Visit: Payer: BC Managed Care – PPO | Admitting: Cardiovascular Disease

## 2012-11-14 ENCOUNTER — Telehealth: Payer: Self-pay | Admitting: Cardiology

## 2012-11-14 MED ORDER — METOPROLOL TARTRATE 12.5 MG HALF TABLET: 12.5000 mg | ORAL_TABLET | Freq: Two times a day (BID) | ORAL | Status: DC

## 2012-11-14 NOTE — Telephone Encounter (Signed)
I spoke with the pt and she needs a refill on Metoprolol Tartrate. Rx sent.  The pt also is going to have labs drawn with her PCP.  I made her aware that the pt needs to have a lipid and liver profile checked.  The pt will have a copy of her labs sent to our office.

## 2012-11-14 NOTE — Telephone Encounter (Signed)
New problem   Per pt was told that her blood work was lost and she needs to repeat labs-pt wants to know what labs she has to repeat

## 2012-12-09 ENCOUNTER — Telehealth: Payer: Self-pay | Admitting: Medical

## 2012-12-09 NOTE — Telephone Encounter (Signed)
Please call patient. Per patient her cardiologist, Dr. Lyn Hollingshead from [facility], faxed a lab order over to our office today for lipid/liver. Patient wants to make sure we received it

## 2012-12-11 ENCOUNTER — Other Ambulatory Visit: Payer: Self-pay | Admitting: Family Medicine

## 2012-12-11 DIAGNOSIS — E785 Hyperlipidemia, unspecified: Secondary | ICD-10-CM

## 2012-12-11 NOTE — Telephone Encounter (Signed)
I left a message on the patients voicemail. CLS 

## 2012-12-27 ENCOUNTER — Other Ambulatory Visit: Payer: BC Managed Care – PPO

## 2012-12-27 DIAGNOSIS — E785 Hyperlipidemia, unspecified: Secondary | ICD-10-CM

## 2012-12-27 LAB — LIPID PANEL
Cholesterol: 89 mg/dL (ref 0–200)
HDL: 33 mg/dL — ABNORMAL LOW (ref >39)
LDL:HDL Ratio: 1.5 Ratio
Triglycerides: 78 mg/dL (ref <150)

## 2012-12-31 ENCOUNTER — Telehealth: Payer: Self-pay | Admitting: Internal Medicine

## 2012-12-31 NOTE — Telephone Encounter (Signed)
Pt called wanting to know the results to her labs she had done on Friday, however the system still says its in process but when i asked Alvino Chapel she had them in her system. Please call patient and let her know the results and then fax to [facility] for pt appt on Thursday.

## 2012-12-31 NOTE — Telephone Encounter (Signed)
pls get me the labs as I can't see/find them

## 2013-01-01 NOTE — Telephone Encounter (Signed)
The labs are in your green folder. i placed in there yesterday

## 2013-01-01 NOTE — Telephone Encounter (Signed)
Patient is aware of her lab results. CLS

## 2013-01-01 NOTE — Telephone Encounter (Signed)
Labs overall looked ok.  I would c/t the current medications she is taking, make sure [facility] gets a copy of her results as they requested.

## 2013-08-20 ENCOUNTER — Ambulatory Visit (INDEPENDENT_AMBULATORY_CARE_PROVIDER_SITE_OTHER): Payer: BC Managed Care – PPO | Admitting: Medical

## 2013-08-20 ENCOUNTER — Encounter: Payer: Self-pay | Admitting: Medical

## 2013-08-20 ENCOUNTER — Ambulatory Visit
Admission: RE | Admit: 2013-08-20 | Discharge: 2013-08-20 | Disposition: A | Discharge status: Home / Self Care | Payer: BC Managed Care – PPO | Source: Ambulatory Visit | Attending: Medical | Admitting: Medical

## 2013-08-20 VITALS — BP 100/78 | HR 72 | Temp 97.5°F | Resp 14 | Wt 196.0 lb

## 2013-08-20 DIAGNOSIS — M79672 Pain in left foot: Secondary | ICD-10-CM

## 2013-08-20 DIAGNOSIS — M7989 Other specified soft tissue disorders: Secondary | ICD-10-CM

## 2013-08-20 DIAGNOSIS — M25472 Effusion, left ankle: Secondary | ICD-10-CM

## 2013-08-20 DIAGNOSIS — M79609 Pain in unspecified limb: Secondary | ICD-10-CM

## 2013-08-20 DIAGNOSIS — M25473 Effusion, unspecified ankle: Secondary | ICD-10-CM

## 2013-08-20 DIAGNOSIS — E669 Obesity, unspecified: Secondary | ICD-10-CM

## 2013-08-20 DIAGNOSIS — M25476 Effusion, unspecified foot: Secondary | ICD-10-CM

## 2013-08-20 NOTE — Progress Notes (Signed)
Subjective: Here for left ankle/foot swelling.  Has been doing a lot of exercising the last 3 months including dancing.  About a month ago mis stepped and fell pain in her left foot.  may have felt a pop.  Since then has had left ankle and feet swelling.  has hx/o ankle injury 7 years ago.  Has used Aleve q 12 hours on certain days with worse pain.  Can bear weight but it hurts.  Using no ice or ankle sleeve or wrap.  No other aggravating or relieving factors. No numbness tingling or weakness. No bruising  She also has questions about weight loss, despite exercise and healthy diet, can't seem to lose weight  Objective: General: Well-developed, well-nourished, no acute distress Skin: No erythema or ecchymosis of left foot no warmth MSK: Mild tenderness of left forefoot volar surface of third and fourth and fifth distal metatarsals, mild swelling of left ankle and foot, nonpitting, otherwise normal range of motion no obvious deformity, rest of lower extremity exam unremarkable Lower extremities neurovascularly intact   Assessment: Encounter Diagnoses  Name Primary?  Marland Kitchen. Foot pain, left Yes  . Left ankle swelling   . Foot swelling   . Obesity, unspecified    Plan: We discussed her concerns and symptoms and exam findings. Her foot and ankle pain are probably inflamed due to overuse however we will send for x-ray to rule out fracture.  Specific recommendations today include:  Go for xrays  If xrays are normal, then treatment will include:  Resting the leg when possible for the next week or two  Use ice pack to the foot/ankle daily for 20 minutes at a time  Elevating the leg above the heart when seated such as in the evening  Using Aleve 1- 2 times daily for pain and inflammation  If not improving in 2 weeks, then recheck  Obesity  Try eating 5 small meals daily  Increase water intake  Exercise 45-60 minutes daily  Consider Weight Watchers program  She will ask cardiology  about Phentermine or Qsymia safety when she follows up soon with cardiology  Followup pending x-ray

## 2013-08-20 NOTE — Patient Instructions (Signed)
  Thank you for giving me the opportunity to serve you today.    Your diagnosis today includes: Encounter Diagnoses  Name Primary?  Marland Kitchen. Foot pain, left Yes  . Left ankle swelling   . Foot swelling   . Obesity, unspecified      Specific recommendations today include:  Go for xrays  If xrays are normal, then treatment will include:  Resting the leg when possible for the next week or two  Use ice pack to the foot/ankle daily for 20 minutes at a time  Elevating the leg above the heart when seated such as in the evening  Using Aleve 1- 2 times daily for pain and inflammation  If not improving in 2 weeks, then recheck  Obesity  Try eating 5 small meals daily  Increase water intake  Exercise 45-60 minutes daily  Consider Weight Watchers program  Ask cardiology about Phentermine or Qsymia safety

## 2013-11-04 ENCOUNTER — Encounter: Payer: Self-pay | Admitting: Medical

## 2013-11-23 IMAGING — CR DG CHEST 2V
2 series · 2 of 2 positions shown · non-contrast
Comparison: None.

CLINICAL DATA: Chest pain

CHEST - 2 VIEW

[w chest pa]
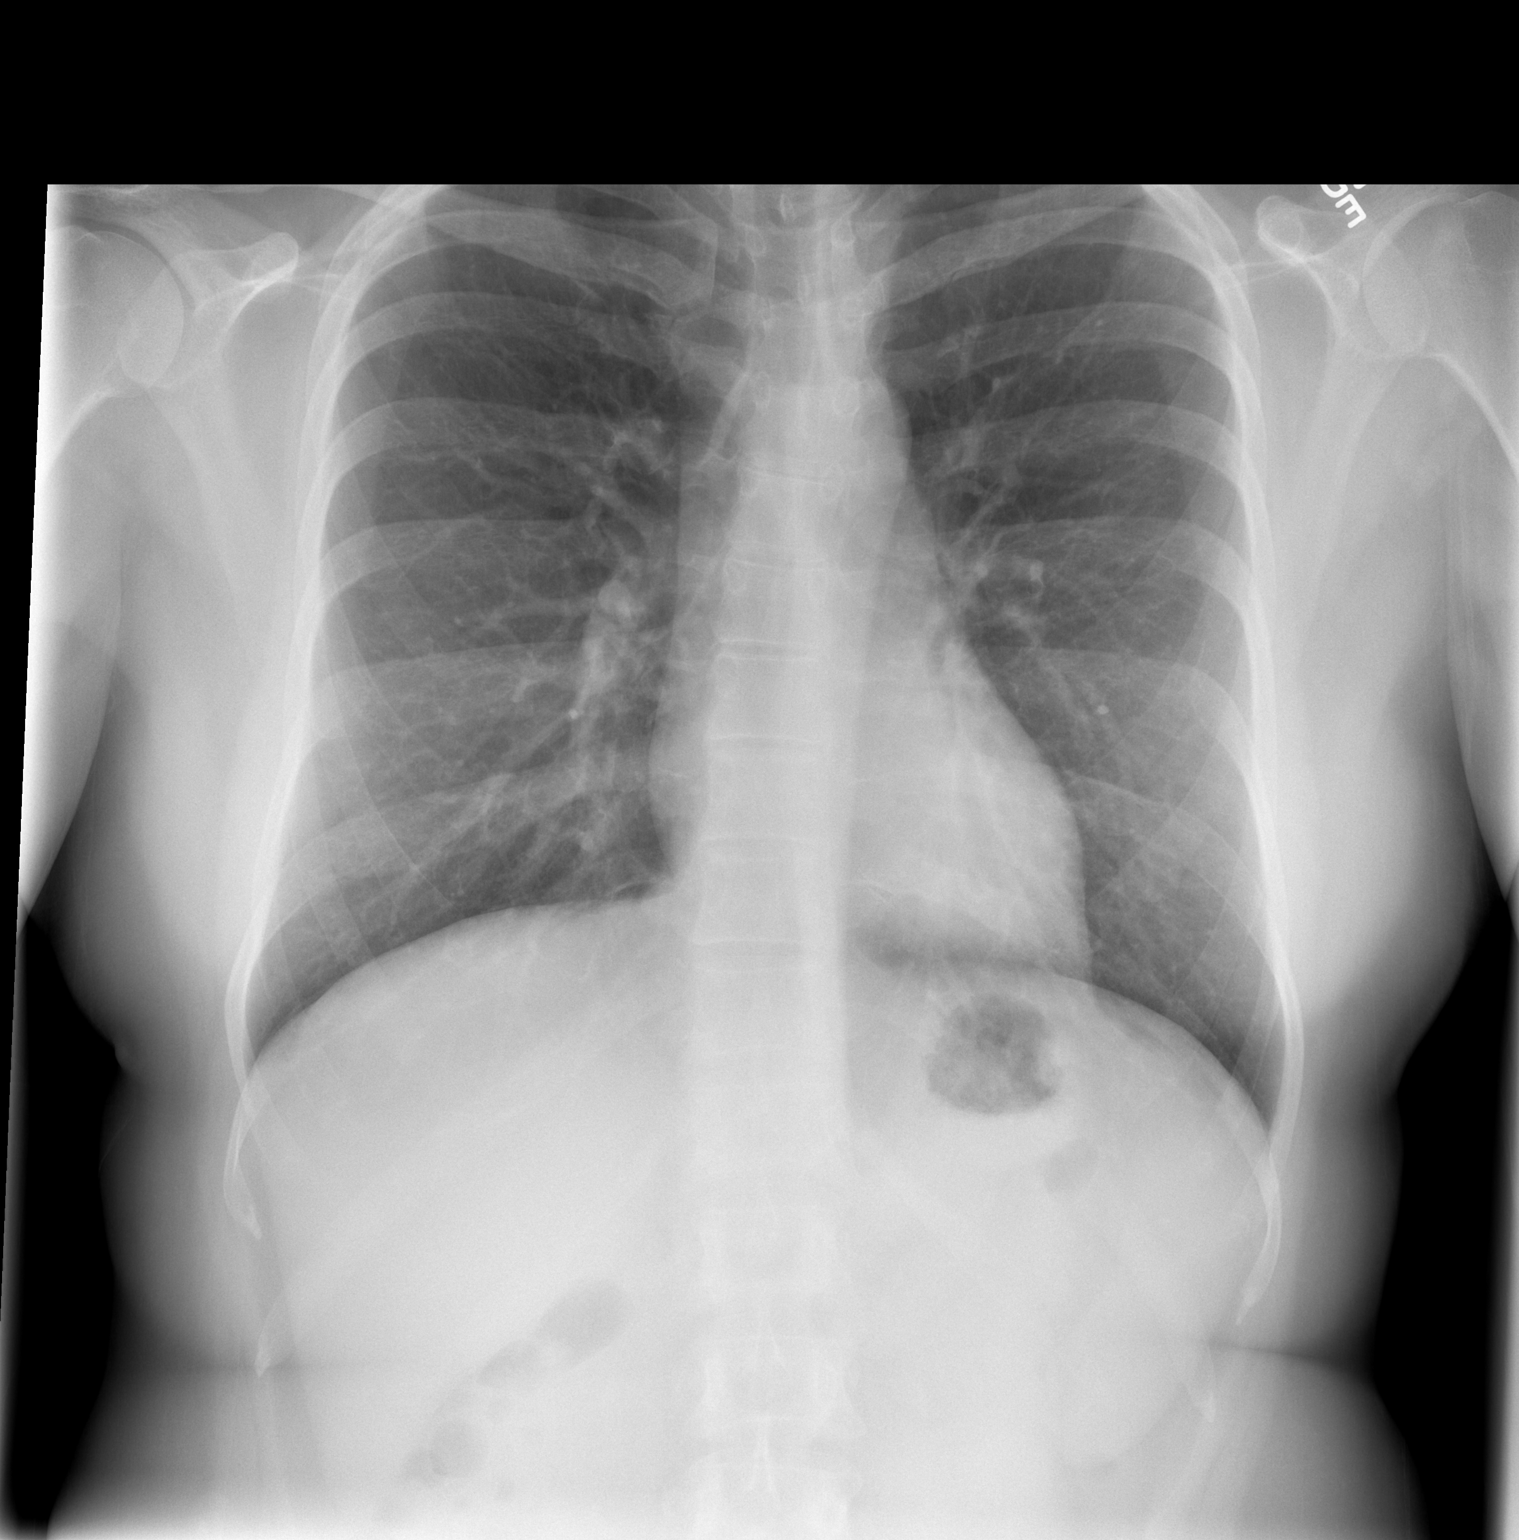

[w chest lat]
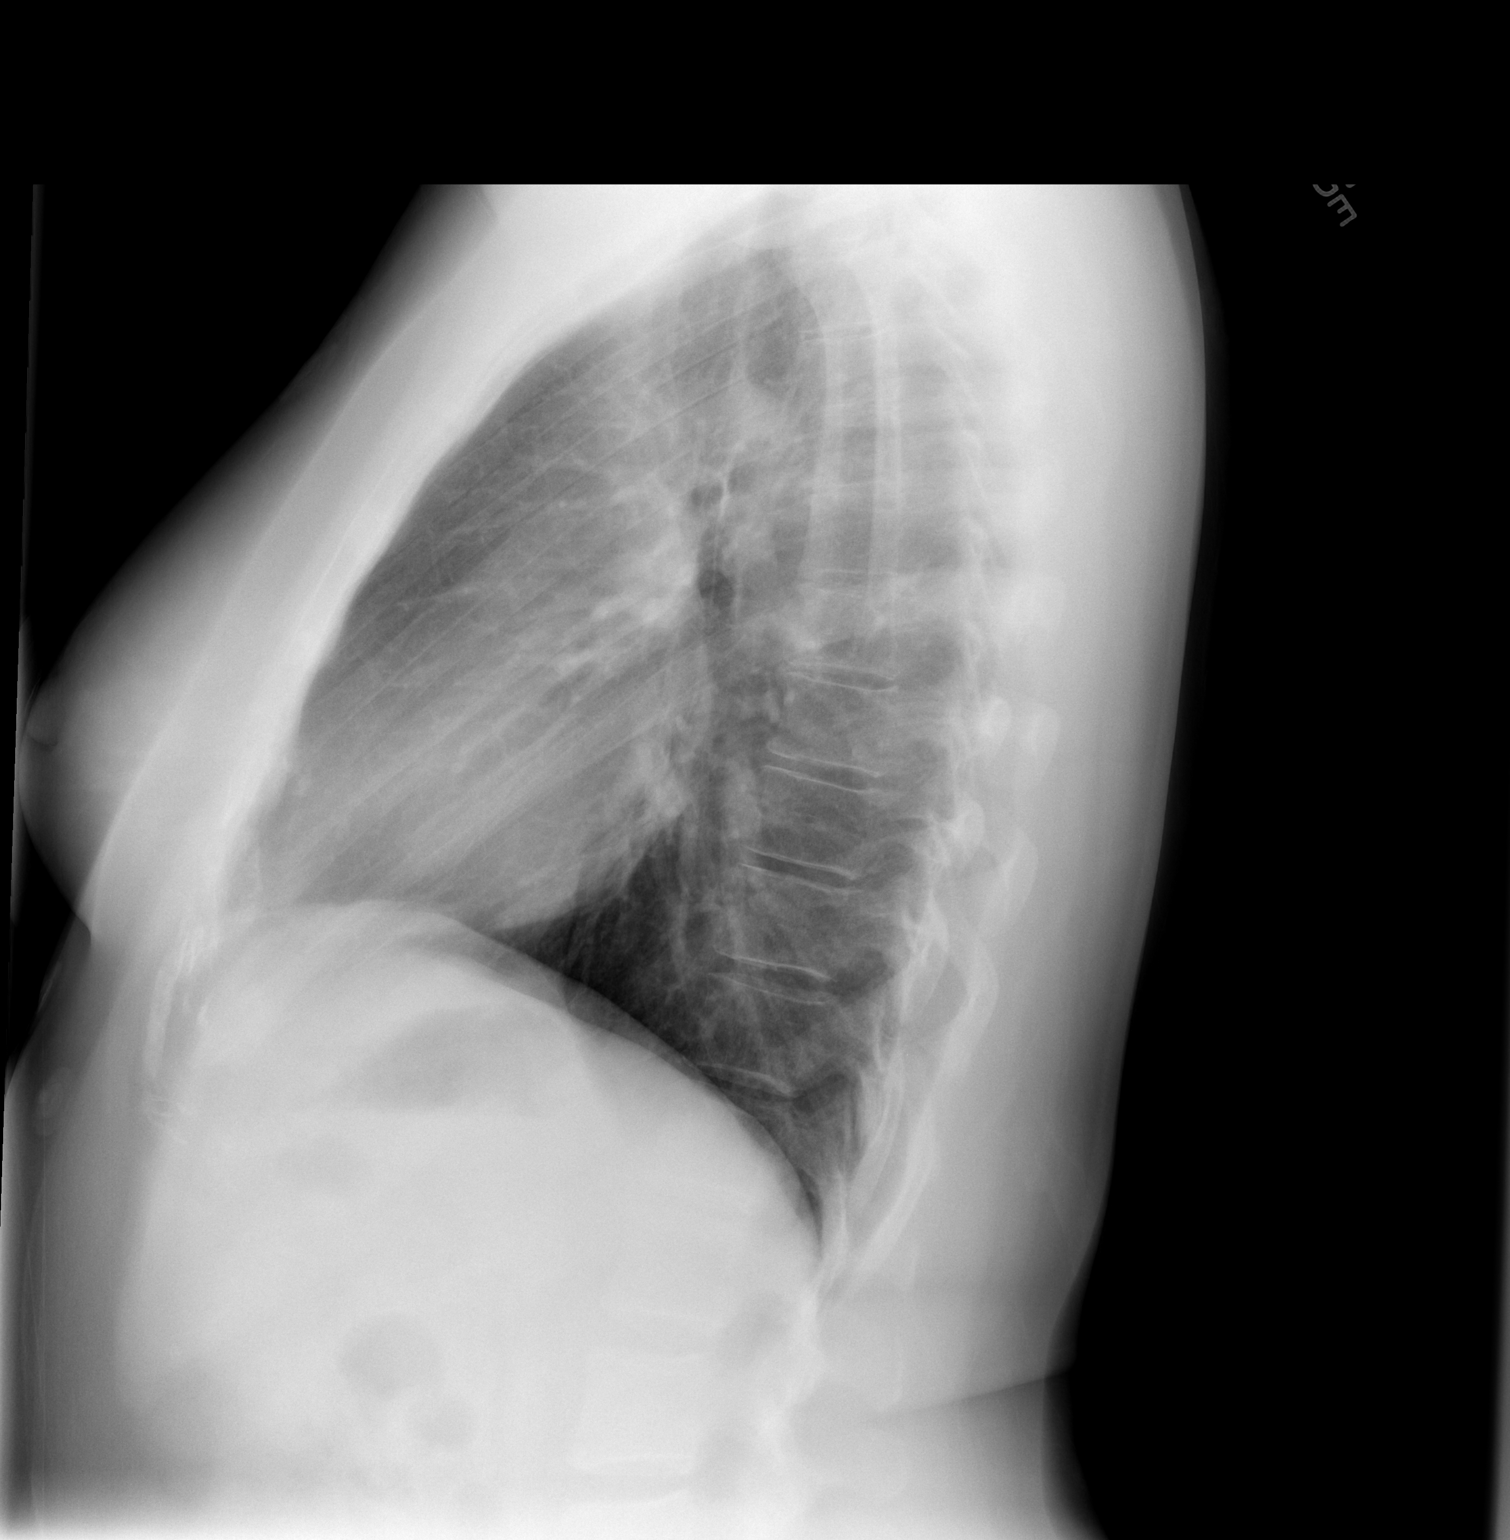

[2 of 2 positions shown; findings below may reference images not displayed]

FINDINGS: The heart and pulmonary vascularity are within normal
limits.  The lungs are clear bilaterally.  No acute bony
abnormality is seen.
IMPRESSION: No acute abnormality noted.

## 2014-03-10 ENCOUNTER — Ambulatory Visit (HOSPITAL_COMMUNITY): Payer: BC Managed Care – PPO

## 2014-04-16 ENCOUNTER — Encounter (HOSPITAL_COMMUNITY): Payer: Self-pay | Admitting: Cardiology

## 2014-10-02 ENCOUNTER — Ambulatory Visit (INDEPENDENT_AMBULATORY_CARE_PROVIDER_SITE_OTHER): Payer: BC Managed Care – PPO | Admitting: Internal Medicine

## 2014-10-02 DIAGNOSIS — Z9189 Other specified personal risk factors, not elsewhere classified: Secondary | ICD-10-CM | POA: Diagnosis not present

## 2014-10-02 DIAGNOSIS — Z7189 Other specified counseling: Secondary | ICD-10-CM

## 2014-10-02 DIAGNOSIS — Z7184 Encounter for health counseling related to travel: Secondary | ICD-10-CM

## 2014-10-02 DIAGNOSIS — Z23 Encounter for immunization: Secondary | ICD-10-CM

## 2014-10-02 MED ORDER — CIPROFLOXACIN HCL 500 MG PO TABS: 500.0000 mg | ORAL_TABLET | Freq: Two times a day (BID) | ORAL | Status: DC

## 2014-10-02 MED ORDER — ATOVAQUONE-PROGUANIL HCL 250-100 MG PO TABS: 1.0000 | ORAL_TABLET | Freq: Every day | ORAL | Status: DC

## 2014-10-02 MED ORDER — TYPHOID VACCINE PO CPDR: 1.0000 | DELAYED_RELEASE_CAPSULE | ORAL | Status: DC

## 2014-10-02 NOTE — Progress Notes (Signed)
   Subjective:    Patient ID: Stacey Garner, female    DOB: 1970-12-16, 44 y.o.   MRN: [id number]  HPI  Traveling with her boyfriend to [place], [place] from July 3rd to aug 4th. She is good state of health presently. They will be visiting family and friends. No plans to travel [place]  No Known Allergies Current Outpatient Prescriptions on File Prior to Visit  Medication Sig Dispense Refill  . aspirin EC 81 MG EC tablet Take 1 tablet (81 mg total) by mouth daily.    Marland Kitchen. atorvastatin (LIPITOR) 80 MG tablet Take 0.5 tablets (40 mg total) by mouth daily at 6 PM.    . fish oil-omega-3 fatty acids 1000 MG capsule Take 2 g by mouth daily.    . metoprolol tartrate (LOPRESSOR) 12.5 mg TABS Take 0.5 tablets (12.5 mg total) by mouth 2 (two) times daily. 30 tablet 6  . MISC NATURAL PRODUCTS PO Take by mouth.    . nitroGLYCERIN (NITROSTAT) 0.4 MG SL tablet Place 1 tablet (0.4 mg total) under the tongue every 5 (five) minutes x 3 doses as needed for chest pain. 25 tablet 3  . Nutritional Supplements (SYTRINOL PO) Take by mouth.     No current facility-administered medications on file prior to visit.       Review of Systems     Objective:   Physical Exam        Assessment & Plan:   will give rx for malaria proph  Will give yellow fever vaccine today and oral typhoid vaccine rx  Traveler's diarrhea = gave rx for cipro  Will give rx for cipro

## 2014-10-21 ENCOUNTER — Encounter: Payer: Self-pay | Admitting: Medical

## 2014-10-21 ENCOUNTER — Ambulatory Visit (INDEPENDENT_AMBULATORY_CARE_PROVIDER_SITE_OTHER): Payer: BC Managed Care – PPO | Admitting: Medical

## 2014-10-21 VITALS — BP 102/80 | HR 80 | Temp 98.2°F | Resp 15 | Wt 204.0 lb

## 2014-10-21 DIAGNOSIS — Z23 Encounter for immunization: Secondary | ICD-10-CM | POA: Diagnosis not present

## 2014-10-21 DIAGNOSIS — Z7185 Encounter for immunization safety counseling: Secondary | ICD-10-CM

## 2014-10-21 DIAGNOSIS — Z7189 Other specified counseling: Secondary | ICD-10-CM

## 2014-10-21 DIAGNOSIS — Z7184 Encounter for health counseling related to travel: Secondary | ICD-10-CM

## 2014-10-21 NOTE — Progress Notes (Signed)
Subjective: Here for advice.  Traveling to the [place] next month for vacation, and her spouse/boyfriend? Is going back to see his family since he is from there.  She recently went to [facility] which was expensive for her, but had new scripts given for Typhoid and malaria prophylaxis, and was given Yellow fever vaccine.   Here today to update Tdap and Hep A.  She says they didn't discuss any other vaccines there.  She notes having tetanus booster in 2006, but has no documentation of prior vaccines.   Last vaccines were probably in 2006 when she gave birth via midwife.  Had prenatal care but can't recall vaccines or titers done then.   She does work with refugees, and in additional to travel wants to update vaccines in general.  No other aggravating or relieving factors. No other complaint.  Objective: BP 102/80 mmHg  Pulse 80  Temp(Src) 98.2 F (36.8 C) (Oral)  Resp 15  Wt 204 lb (92.534 kg) Gen: wd, wn, nad   Assessment: Encounter Diagnoses  Name Primary?  . Travel advice encounter Yes  . Vaccine counseling   . Need for Tdap vaccination   . Need for prophylactic vaccination and inoculation against viral hepatitis     Plan: Discussed her upcoming travel to [facility].   unfortunately she doesn't have any vaccine records.   Advised she try and get copies of prior vaccines either at her college or prior medical provider office.  We offered titers for MMR, Hep B, varicella but she declines.   We willl update tdap and Hep A.   She was advised to have pneumococcal 23 but declines for now. She already has had yellow fever vaccine recently and was given scripts for malaria prophylaxis and typhoid vaccine per travel clinic.   advised yearly flu shot.   Gave info from [employer] on [employer] travel.    Counseled on the Tdap (tetanus, diptheria, and acellular pertussis) vaccine.  Vaccine information sheet given. Tdap vaccine given after consent obtained.  Counseled on the Hepatitis A virus  vaccine.  Vaccine information sheet given.  Hepatitis A vaccine given after consent obtained.  Patient was advised to return in 6 months for Hep A #2.  F/u soon for physical

## 2014-12-01 IMAGING — CR DG FOOT COMPLETE 3+V*L*
3 series · 3 of 3 positions shown · non-contrast
Comparison: None.

CLINICAL DATA: Metatarsal pain and swelling

EXAM:
LEFT FOOT - COMPLETE 3+ VIEW

[view not recorded (1 of 3)]
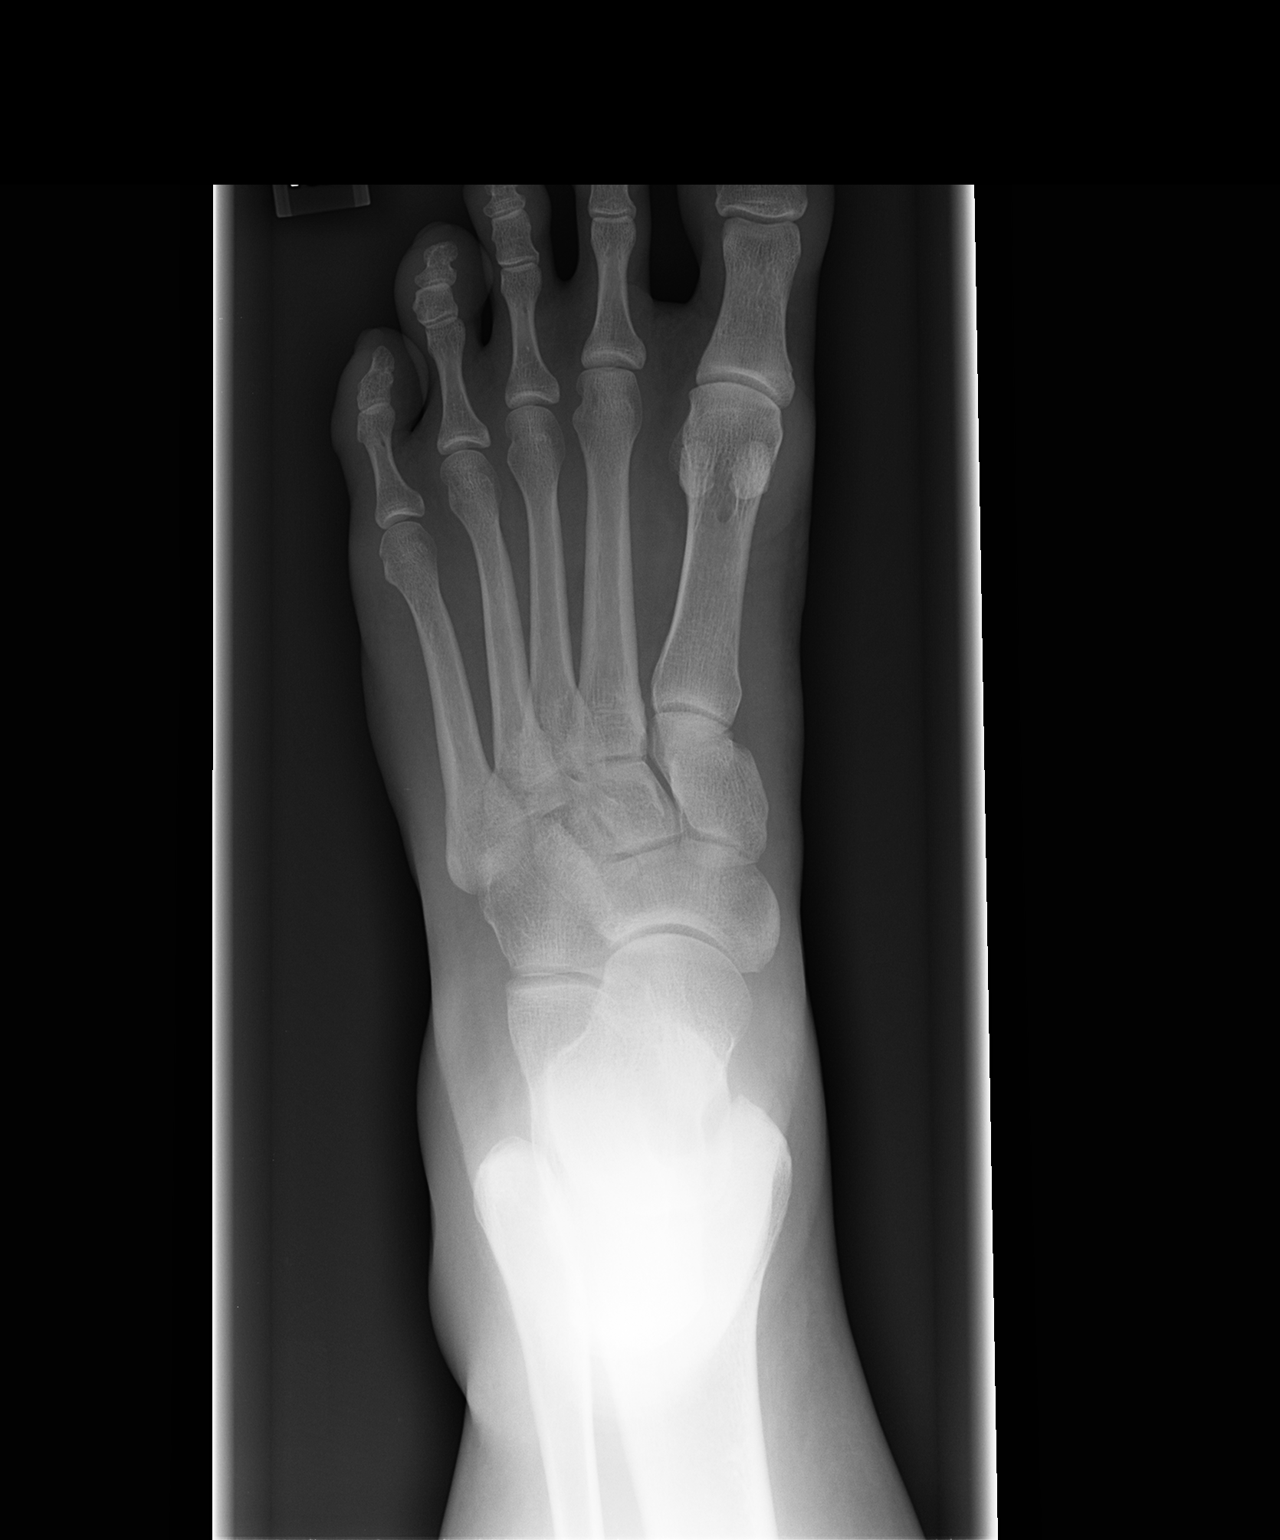

[view not recorded (2 of 3)]
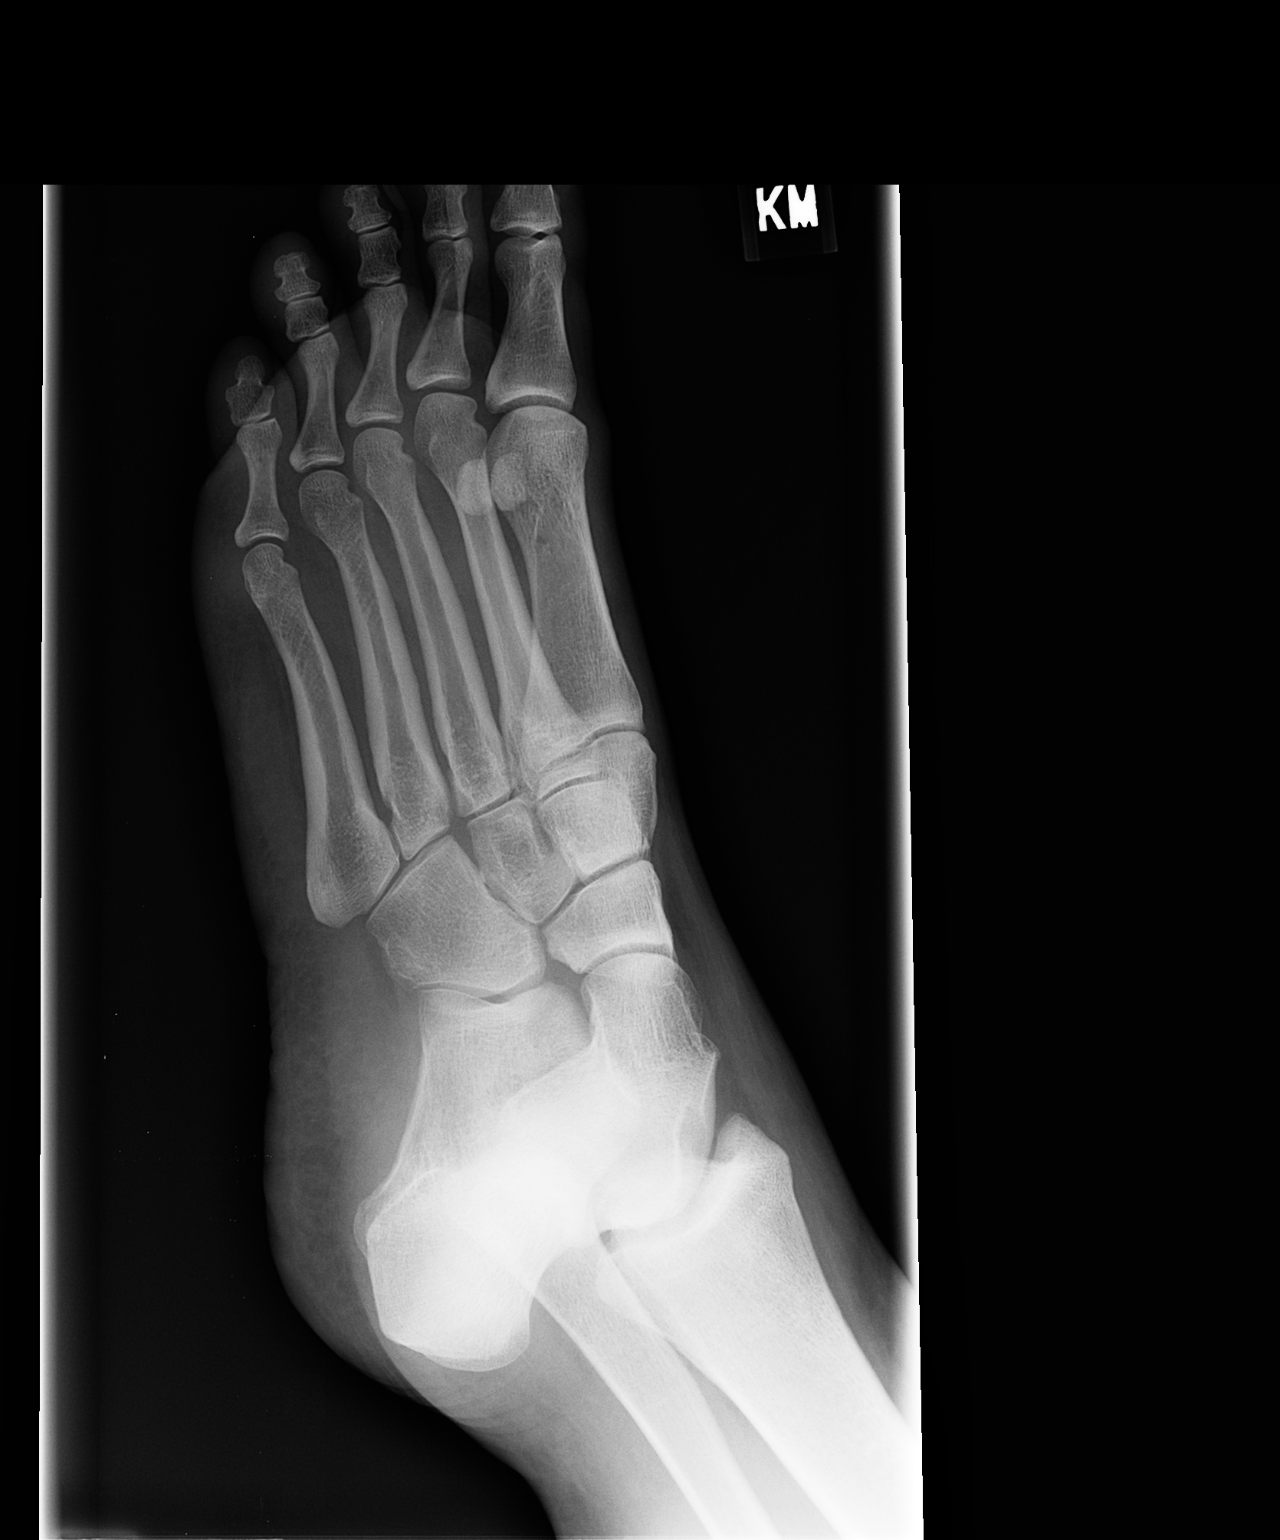

[view not recorded (3 of 3)]
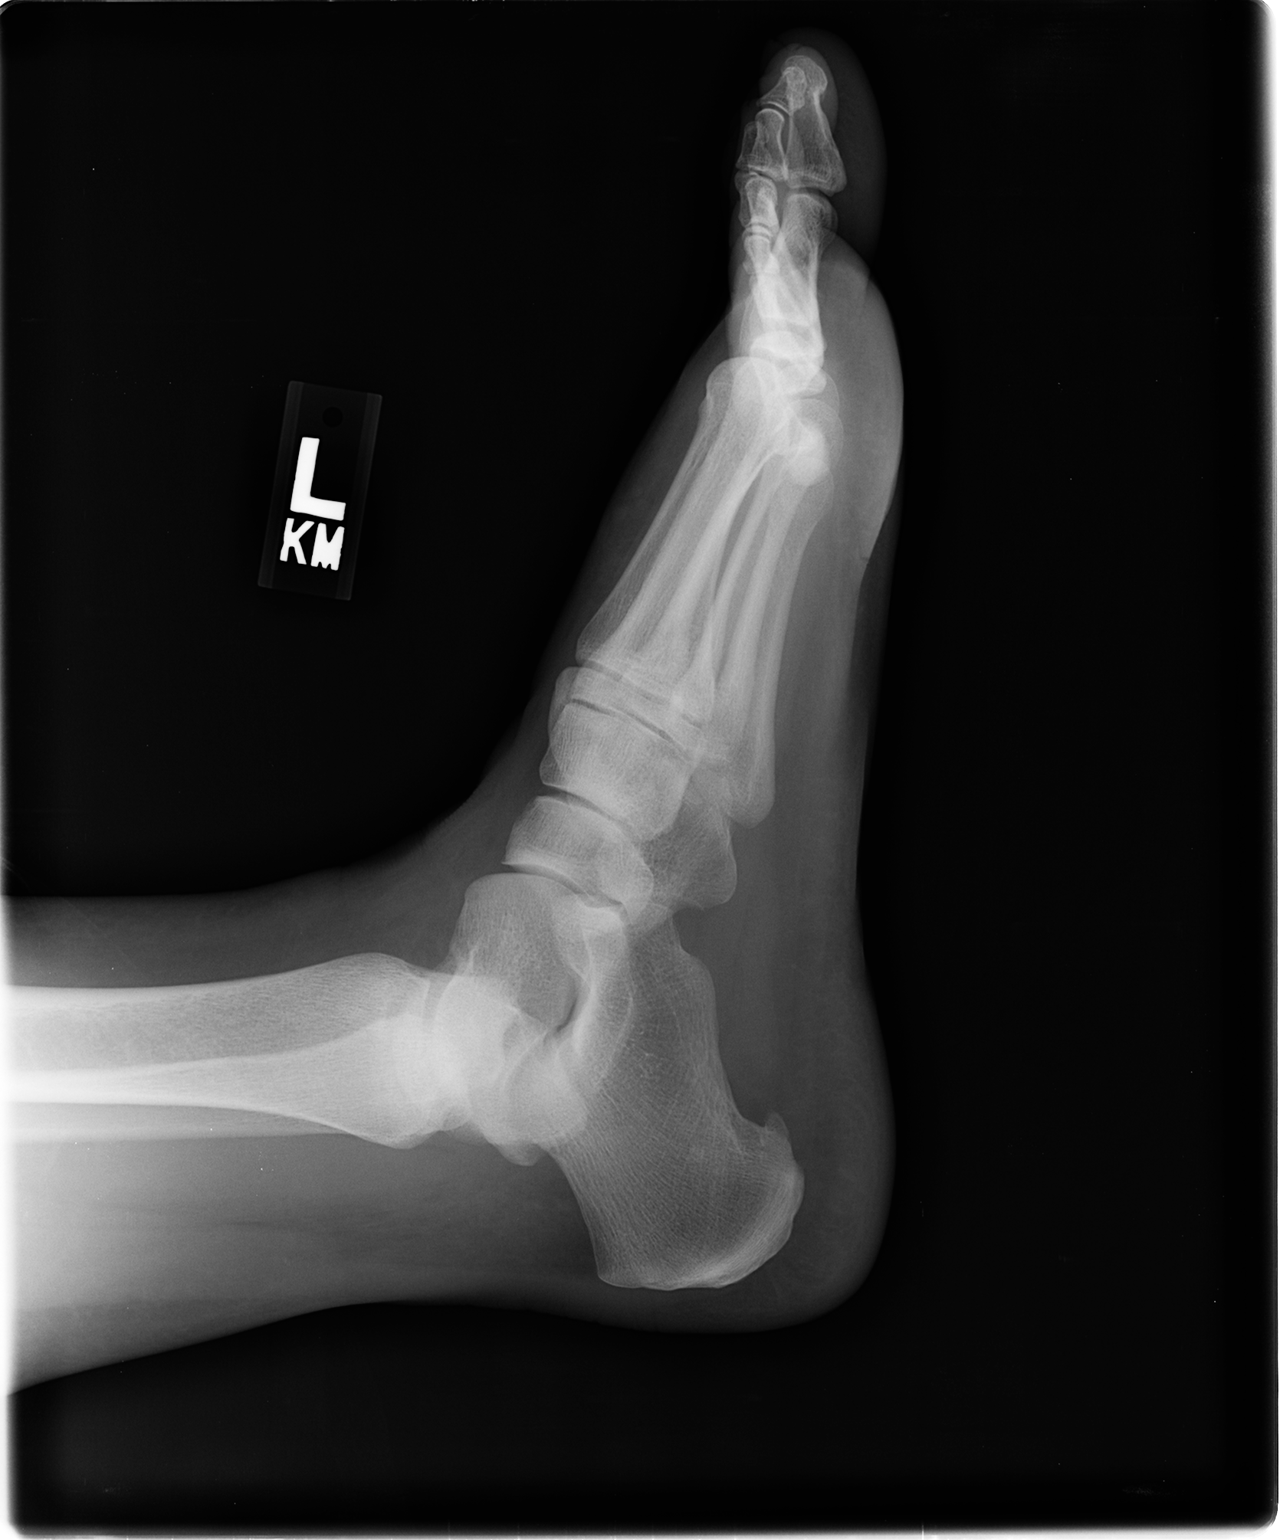

[3 of 3 positions shown; findings below may reference images not displayed]

FINDINGS: There is no evidence of fracture or dislocation. Small calcaneal
spur at the plantar aponeurosis. There is no evidence of arthropathy
or other focal bone abnormality. Soft tissues are unremarkable.
IMPRESSION: Negative.

## 2016-01-24 ENCOUNTER — Telehealth: Payer: Self-pay | Admitting: Medical

## 2016-01-24 ENCOUNTER — Ambulatory Visit: Payer: BC Managed Care – PPO | Admitting: Family Medicine

## 2016-01-24 DIAGNOSIS — Z0289 Encounter for other administrative examinations: Secondary | ICD-10-CM

## 2016-01-24 NOTE — Telephone Encounter (Signed)
No f/u at this time. 

## 2016-01-24 NOTE — Telephone Encounter (Signed)
Patient did not come in for their appointment today for new patient appointment.  Please let me know if patient needs to be contacted immediately for follow up or no follow up needed.

## 2019-01-02 ENCOUNTER — Telehealth: Payer: BC Managed Care – PPO | Admitting: Physician Assistant

## 2019-01-02 ENCOUNTER — Other Ambulatory Visit: Payer: Self-pay

## 2019-01-02 DIAGNOSIS — R059 Cough, unspecified: Secondary | ICD-10-CM

## 2019-01-02 DIAGNOSIS — R05 Cough: Secondary | ICD-10-CM

## 2019-01-02 DIAGNOSIS — Z20822 Contact with and (suspected) exposure to covid-19: Secondary | ICD-10-CM

## 2019-01-02 MED ORDER — BENZONATATE 100 MG PO CAPS: 100.0000 mg | ORAL_CAPSULE | Freq: Three times a day (TID) | ORAL | 0 refills | Status: DC | PRN

## 2019-01-02 NOTE — Progress Notes (Signed)
E-Visit for Corona Virus Screening   Your current symptoms could be consistent with the coronavirus.  Many health care providers can now test patients at their office but not all are.  [facility] has multiple testing sites. For information on our COVID testing locations and hours go to [email or web address]  Please quarantine yourself while awaiting your test results.  We are enrolling you in our MyChart Home Montioring for COVID19 . Daily you will receive a questionnaire within the MyChart website. Our COVID 19 response team willl be monitoriing your responses daily.    COVID-19 is a respiratory illness with symptoms that are similar to the flu. Symptoms are typically mild to moderate, but there have been cases of severe illness and death due to the virus. The following symptoms may appear 2-14 days after exposure: . Fever . Cough . Shortness of breath or difficulty breathing . Chills . Repeated shaking with chills . Muscle pain . Headache . Sore throat . New loss of taste or smell . Fatigue . Congestion or runny nose . Nausea or vomiting . Diarrhea  It is vitally important that if you feel that you have an infection such as this virus or any other virus that you stay home and away from places where you may spread it to others.  You should self-quarantine for 14 days if you have symptoms that could potentially be coronavirus or have been in close contact a with a person diagnosed with COVID-19 within the last 2 weeks. You should avoid contact with people age 65 and older.   You should wear a mask or cloth face covering over your nose and mouth if you must be around other people or animals, including pets (even at home). Try to stay at least 6 feet away from other people. This will protect the people around you.  You can use medication such as A prescription cough medication called Tessalon Perles 100 mg. You may take 1-2 capsules every 8 hours as needed for  cough  You may also take acetaminophen (Tylenol) as needed for fever.   Reduce your risk of any infection by using the same precautions used for avoiding the common cold or flu:  . Wash your hands often with soap and warm water for at least 20 seconds.  If soap and water are not readily available, use an alcohol-based hand sanitizer with at least 60% alcohol.  . If coughing or sneezing, cover your mouth and nose by coughing or sneezing into the elbow areas of your shirt or coat, into a tissue or into your sleeve (not your hands). . Avoid shaking hands with others and consider head nods or verbal greetings only. . Avoid touching your eyes, nose, or mouth with unwashed hands.  . Avoid close contact with people who are sick. . Avoid places or events with large numbers of people in one location, like concerts or sporting events. . Carefully consider travel plans you have or are making. . If you are planning any travel outside or inside the [place], visit the CDC's Travelers' Health webpage for the latest health notices. . If you have some symptoms but not all symptoms, continue to monitor at home and seek medical attention if your symptoms worsen. . If you are having a medical emergency, call 911.  HOME CARE . Only take medications as instructed by your medical team. . Drink plenty of fluids and get plenty of rest. . A steam or ultrasonic humidifier can help if you have congestion.     GET HELP RIGHT AWAY IF YOU HAVE EMERGENCY WARNING SIGNS** FOR COVID-19. If you or someone is showing any of these signs seek emergency medical care immediately. Call 911 or proceed to your closest emergency facility if: . You develop worsening high fever. . Trouble breathing . Bluish lips or face . Persistent pain or pressure in the chest . New confusion . Inability to wake or stay awake . You cough up blood. . Your symptoms become more severe  **This list is not all possible symptoms. Contact your medical  provider for any symptoms that are sever or concerning to you.   MAKE SURE YOU   Understand these instructions.  Will watch your condition.  Will get help right away if you are not doing well or get worse.  Your e-visit answers were reviewed by a board certified advanced clinical practitioner to complete your personal care plan.  Depending on the condition, your plan could have included both over the counter or prescription medications.  If there is a problem please reply once you have received a response from your provider.  Your safety is important to us.  If you have drug allergies check your prescription carefully.    You can use MyChart to ask questions about today's visit, request a non-urgent call back, or ask for a work or school excuse for 24 hours related to this e-Visit. If it has been greater than 24 hours you will need to follow up with your provider, or enter a new e-Visit to address those concerns. You will get an e-mail in the next two days asking about your experience.  I hope that your e-visit has been valuable and will speed your recovery. Thank you for using e-visits.  I have spent 5 minutes in review of e-visit questionnaire, review and updating patient chart, medical decision making and response to patient.    Christain Niznik, PA-C    

## 2019-01-04 LAB — NOVEL CORONAVIRUS, NAA: SARS-CoV-2, NAA: NOT DETECTED

## 2019-01-04 LAB — SPECIMEN STATUS REPORT

## 2019-07-05 ENCOUNTER — Ambulatory Visit: Payer: BC Managed Care – PPO | Attending: Internal Medicine

## 2019-07-05 DIAGNOSIS — Z23 Encounter for immunization: Secondary | ICD-10-CM

## 2019-07-05 NOTE — Progress Notes (Signed)
   Covid-19 Vaccination Clinic  Name:  Mercede Rollo    MRN: [id number] DOB: Aug 24, 1970  07/05/2019  Ms. Liska was observed post Covid-19 immunization for 30 minutes based on pre-vaccination screening without incidence. She was provided with Vaccine Information Sheet and instruction to access the V-Safe system.   Ms. Dede was instructed to call 911 with any severe reactions post vaccine: Marland Kitchen Difficulty breathing  . Swelling of your face and throat  . A fast heartbeat  . A bad rash all over your body  . Dizziness and weakness    Immunizations Administered    Name Date Dose VIS Date Route   Pfizer COVID-19 Vaccine 07/05/2019 12:25 PM 0.3 mL 04/18/2019 Intramuscular   Manufacturer: [employer], [employer]   Lot: [id number]   NDC: [address]-4

## 2019-07-29 ENCOUNTER — Ambulatory Visit: Payer: BC Managed Care – PPO | Attending: Internal Medicine

## 2019-07-29 DIAGNOSIS — Z23 Encounter for immunization: Secondary | ICD-10-CM

## 2019-07-29 NOTE — Progress Notes (Signed)
   Covid-19 Vaccination Clinic  Name:  Stacey Garner    MRN: [id number] DOB: Jan 03, 1971  07/29/2019  [name] was observed post Covid-19 immunization for 30 minutes based on pre-vaccination screening without incident. She was provided with Vaccine Information Sheet and instruction to access the V-Safe system.   [name] was instructed to call 911 with any severe reactions post vaccine: Marland Kitchen Difficulty breathing  . Swelling of face and throat  . A fast heartbeat  . A bad rash all over body  . Dizziness and weakness   Immunizations Administered    Name Date Dose VIS Date Route   Pfizer COVID-19 Vaccine 07/29/2019  4:45 PM 0.3 mL 04/18/2019 Intramuscular   Manufacturer: [employer], [employer]   Lot: [id number]   NDC: [address]-9

## 2019-08-05 ENCOUNTER — Ambulatory Visit: Payer: BC Managed Care – PPO

## 2020-05-27 ENCOUNTER — Ambulatory Visit: Payer: BC Managed Care – PPO

## 2020-05-27 ENCOUNTER — Other Ambulatory Visit: Payer: Self-pay

## 2020-08-18 ENCOUNTER — Ambulatory Visit (INDEPENDENT_AMBULATORY_CARE_PROVIDER_SITE_OTHER): Payer: BC Managed Care – PPO | Admitting: Family Medicine

## 2020-08-18 ENCOUNTER — Other Ambulatory Visit: Payer: Self-pay

## 2020-08-18 ENCOUNTER — Encounter (INDEPENDENT_AMBULATORY_CARE_PROVIDER_SITE_OTHER): Payer: Self-pay | Admitting: Family Medicine

## 2020-08-18 VITALS — BP 108/67 | HR 78 | Temp 97.8°F | Ht 66.0 in | Wt 228.0 lb

## 2020-08-18 DIAGNOSIS — R5383 Other fatigue: Secondary | ICD-10-CM

## 2020-08-18 DIAGNOSIS — Z6836 Body mass index (BMI) 36.0-36.9, adult: Secondary | ICD-10-CM

## 2020-08-18 DIAGNOSIS — I214 Non-ST elevation (NSTEMI) myocardial infarction: Secondary | ICD-10-CM

## 2020-08-18 DIAGNOSIS — Z0289 Encounter for other administrative examinations: Secondary | ICD-10-CM

## 2020-08-18 DIAGNOSIS — Z1331 Encounter for screening for depression: Secondary | ICD-10-CM | POA: Diagnosis not present

## 2020-08-18 DIAGNOSIS — E559 Vitamin D deficiency, unspecified: Secondary | ICD-10-CM | POA: Diagnosis not present

## 2020-08-18 DIAGNOSIS — E785 Hyperlipidemia, unspecified: Secondary | ICD-10-CM | POA: Diagnosis not present

## 2020-08-18 DIAGNOSIS — Z9189 Other specified personal risk factors, not elsewhere classified: Secondary | ICD-10-CM | POA: Diagnosis not present

## 2020-08-18 DIAGNOSIS — R0602 Shortness of breath: Secondary | ICD-10-CM | POA: Diagnosis not present

## 2020-08-19 LAB — COMPREHENSIVE METABOLIC PANEL
ALT: 17 IU/L (ref 0–32)
AST: 18 IU/L (ref 0–40)
Albumin/Globulin Ratio: 1.9 (ref 1.2–2.2)
Albumin: 4.3 g/dL (ref 3.8–4.8)
Alkaline Phosphatase: 77 IU/L (ref 44–121)
BUN/Creatinine Ratio: 15 (ref 9–23)
BUN: 12 mg/dL (ref 6–24)
Bilirubin Total: 0.5 mg/dL (ref 0.0–1.2)
CO2: 22 mmol/L (ref 20–29)
Calcium: 8.9 mg/dL (ref 8.7–10.2)
Chloride: 99 mmol/L (ref 96–106)
Creatinine, Ser: 0.79 mg/dL (ref 0.57–1.00)
Globulin, Total: 2.3 g/dL (ref 1.5–4.5)
Glucose: 86 mg/dL (ref 65–99)
Potassium: 4.3 mmol/L (ref 3.5–5.2)
Sodium: 139 mmol/L (ref 134–144)
Total Protein: 6.6 g/dL (ref 6.0–8.5)
eGFR: 93 mL/min/{1.73_m2} (ref >59)

## 2020-08-19 LAB — CBC WITH DIFFERENTIAL/PLATELET
Basophils Absolute: 0 10*3/uL (ref 0.0–0.2)
Basos: 0 %
EOS (ABSOLUTE): 0.1 10*3/uL (ref 0.0–0.4)
Eos: 1 %
Hematocrit: 46.5 % (ref 34.0–46.6)
Hemoglobin: 15.7 g/dL (ref 11.1–15.9)
Immature Grans (Abs): 0 10*3/uL (ref 0.0–0.1)
Immature Granulocytes: 0 %
Lymphocytes Absolute: 1.2 10*3/uL (ref 0.7–3.1)
Lymphs: 17 %
MCH: 30.5 pg (ref 26.6–33.0)
MCHC: 33.8 g/dL (ref 31.5–35.7)
MCV: 90 fL (ref 79–97)
Monocytes Absolute: 0.5 10*3/uL (ref 0.1–0.9)
Monocytes: 7 %
Neutrophils Absolute: 5.1 10*3/uL (ref 1.4–7.0)
Neutrophils: 75 %
Platelets: 158 10*3/uL (ref 150–450)
RBC: 5.15 x10E6/uL (ref 3.77–5.28)
RDW: 12.3 % (ref 11.7–15.4)
WBC: 6.9 10*3/uL (ref 3.4–10.8)

## 2020-08-19 LAB — TSH: TSH: 2.09 u[IU]/mL (ref 0.450–4.500)

## 2020-08-19 LAB — LIPID PANEL WITH LDL/HDL RATIO
Cholesterol, Total: 224 mg/dL — ABNORMAL HIGH (ref 100–199)
HDL: 39 mg/dL — ABNORMAL LOW (ref >39)
LDL Chol Calc (NIH): 141 mg/dL — ABNORMAL HIGH (ref 0–99)
LDL/HDL Ratio: 3.6 ratio — ABNORMAL HIGH (ref 0.0–3.2)
Triglycerides: 244 mg/dL — ABNORMAL HIGH (ref 0–149)
VLDL Cholesterol Cal: 44 mg/dL — ABNORMAL HIGH (ref 5–40)

## 2020-08-19 LAB — VITAMIN B12: Vitamin B-12: 414 pg/mL (ref 232–1245)

## 2020-08-19 LAB — T3: T3, Total: 162 ng/dL (ref 71–180)

## 2020-08-19 LAB — HEMOGLOBIN A1C
Est. average glucose Bld gHb Est-mCnc: 108 mg/dL
Hgb A1c MFr Bld: 5.4 % (ref 4.8–5.6)

## 2020-08-19 LAB — INSULIN, RANDOM: INSULIN: 7.5 u[IU]/mL (ref 2.6–24.9)

## 2020-08-19 LAB — VITAMIN D 25 HYDROXY (VIT D DEFICIENCY, FRACTURES): Vit D, 25-Hydroxy: 20 ng/mL — ABNORMAL LOW (ref 30.0–100.0)

## 2020-08-19 LAB — T4: T4, Total: 10.6 ug/dL (ref 4.5–12.0)

## 2020-08-19 LAB — FOLATE: Folate: 15.8 ng/mL (ref >3.0)

## 2020-08-26 NOTE — Progress Notes (Signed)
Chief Complaint:   OBESITY Stacey Garner (MR# [id number]) is a 50 y.o. female who presents for evaluation and treatment of obesity and related comorbidities. Current BMI is Body mass index is 36.8 kg/m. Stacey Garner has been struggling with her weight for many years and has been unsuccessful in either losing weight, maintaining weight loss, or reaching her healthy weight goal.  Stacey Garner is currently in the action stage of change and ready to dedicate time achieving and maintaining a healthier weight. Stacey Garner is interested in becoming our patient and working on intensive lifestyle modifications including (but not limited to) diet and exercise for weight loss.  Stacey Garner's habits were reviewed today and are as follows: Her family eats meals together, her desired weight loss is 68 lbs, she started gaining weight in 2012, her heaviest weight ever was 230 pounds, she has significant food cravings issues, she skips meals frequently and she struggles with emotional eating.  Depression Screen  Stacey Garner's Food and Mood (modified PHQ-9) score was 12.  Depression screen PHQ 2/9 08/18/2020  Decreased Interest 2  Down, Depressed, Hopeless 1  PHQ - 2 Score 3  Altered sleeping 2  Tired, decreased energy 2  Change in appetite 3  Feeling bad or failure about yourself  0  Trouble concentrating 1  Moving slowly or fidgety/restless 1  Suicidal thoughts 0  PHQ-9 Score 12  Difficult doing work/chores Not difficult at all   Subjective:   1. Other fatigue Stacey Garner admits to daytime somnolence and admits to waking up still tired. Patent has a history of symptoms of daytime fatigue. Stacey Garner generally gets 4 hours of sleep per night, and states that she has difficulty falling asleep. Snoring is not present. Apneic episodes are not present. Epworth Sleepiness Score is 7.  2. Shortness of breath on exertion Stacey Garner notes increasing shortness of breath with exercising and seems to be worsening over time with weight gain. She notes  getting out of breath sooner with activity than she used to. This has not gotten worse recently. Stacey Garner denies shortness of breath at rest or orthopnea.  3. Hyperlipidemia, unspecified hyperlipidemia type Lovely is not on statin. She has a history of STEMI with a coronary artery dissection. She is followed by Cardiology.  4. Vitamin D deficiency Kenlyn is not on Vit D, and she has a history of low Vit D. She notes fatigue.  5. NSTEMI (non-ST elevated myocardial infarction) (HCC) Stacey Garner has a history of coronary artery dissection in 2014. She is no longer on statin or B-blocker. She is followed by Cardiology.  6. At risk for impaired metabolic function Stacey Garner is at increased risk for impaired metabolic function due to current nutrition and muscle mass.  Assessment/Plan:   1. Other fatigue Stacey Garner does feel that her weight is causing her energy to be lower than it should be. Fatigue may be related to obesity, depression or many other causes. Labs will be ordered, and in the meanwhile, Taleia will focus on self care including making healthy food choices, increasing physical activity and focusing on stress reduction.  - Vitamin B12 - CBC with Differential/Platelet - Comprehensive metabolic panel - Folate - Hemoglobin A1c - Insulin, random - T3 - T4 - TSH  2. Shortness of breath on exertion Stacey Garner does feel that she gets out of breath more easily that she used to when she exercises. Stacey Garner's shortness of breath appears to be obesity related and exercise induced. She has agreed to work on weight loss and gradually increase exercise  to treat her exercise induced shortness of breath. Will continue to monitor closely.  3. Hyperlipidemia, unspecified hyperlipidemia type Cardiovascular risk and specific lipid/LDL goals reviewed. We discussed several lifestyle modifications today. We will check labs today. Stacey Garner will continue start her Category 2 plan, and will continue to work on exercise and weight loss  efforts. Orders and follow up as documented in patient record.   - Lipid Panel With LDL/HDL Ratio  4. Vitamin D deficiency Low Vitamin D level contributes to fatigue and are associated with obesity, breast, and colon cancer. We will check labs today. Stacey Garner will follow-up for routine testing of Vitamin D, at least 2-3 times per year to avoid over-replacement.  - VITAMIN D 25 Hydroxy (Vit-D Deficiency, Fractures)  5. NSTEMI (non-ST elevated myocardial infarction) (HCC) Stacey Garner is to work on diet and exercise, and continue to follow up with Cardiology.  6. Screening for depression Elmer had a positive depression screening. Depression is commonly associated with obesity and often results in emotional eating behaviors. We will monitor this closely and work on CBT to help improve the non-hunger eating patterns. Referral to Psychology may be required if no improvement is seen as she continues in our clinic.  7. At risk for impaired metabolic function Stacey Garner was given approximately 30 minutes of impaired  metabolic function prevention counseling today. We discussed intensive lifestyle modifications today with an emphasis on specific nutrition and exercise instructions and strategies.   Repetitive spaced learning was employed today to elicit superior memory formation and behavioral change.  8. Class 2 severe obesity with serious comorbidity and body mass index (BMI) of 36.0 to 36.9 in adult, unspecified obesity type (HCC) Stacey Garner is currently in the action stage of change and her goal is to continue with weight loss efforts. I recommend Stacey Garner begin the structured treatment plan as follows:  She has agreed to the Category 2 Plan + 100 calories.  Exercise goals: No exercise has been prescribed for now, while we concentrate on nutritional changes.   Behavioral modification strategies: increasing lean protein intake and meal planning and cooking strategies.  She was informed of the importance of frequent  follow-up visits to maximize her success with intensive lifestyle modifications for her multiple health conditions. She was informed we would discuss her lab results at her next visit unless there is a critical issue that needs to be addressed sooner. Danikah agreed to keep her next visit at the agreed upon time to discuss these results.  Objective:   Blood pressure 108/67, pulse 78, temperature 97.8 F (36.6 C), height 5\' 6"  (1.676 m), weight 228 lb (103.4 kg), SpO2 97 %. Body mass index is 36.8 kg/m.  EKG: Normal sinus rhythm, rate unable to obtain.  Indirect Calorimeter completed today shows a VO2 of 254 and a REE of 1769.  Her calculated basal metabolic rate is thus her basal metabolic rate is better than expected.  General: Cooperative, alert, well developed, in no acute distress. HEENT: Conjunctivae and lids unremarkable. Cardiovascular: Regular rhythm.  Lungs: Normal work of breathing. Neurologic: No focal deficits.   Lab Results  Component Value Date   CREATININE 0.79 08/18/2020   BUN 12 08/18/2020   NA 139 08/18/2020   K 4.3 08/18/2020   CL 99 08/18/2020   CO2 22 08/18/2020   Lab Results  Component Value Date   ALT 17 08/18/2020   AST 18 08/18/2020   ALKPHOS 77 08/18/2020   BILITOT 0.5 08/18/2020   Lab Results  Component Value Date  HGBA1C 5.4 08/18/2020   Lab Results  Component Value Date   INSULIN 7.5 08/18/2020   Lab Results  Component Value Date   TSH 2.090 08/18/2020   Lab Results  Component Value Date   CHOL 224 (H) 08/18/2020   HDL 39 (L) 08/18/2020   LDLCALC 141 (H) 08/18/2020   LDLDIRECT 50 12/27/2012   LDLDIRECT 50 12/27/2012   TRIG 244 (H) 08/18/2020   CHOLHDL 2.7 12/27/2012   Lab Results  Component Value Date   WBC 6.9 08/18/2020   HGB 15.7 08/18/2020   HCT 46.5 08/18/2020   MCV 90 08/18/2020   PLT 158 08/18/2020   No results found for: IRON, TIBC, FERRITIN  Attestation Statements:   Reviewed by clinician on day of visit:  allergies, medications, problem list, medical history, surgical history, family history, social history, and previous encounter notes.   I, Burt Knack, am acting as transcriptionist for Quillian Quince, MD.  I have reviewed the above documentation for accuracy and completeness, and I agree with the above. - Quillian Quince, MD

## 2020-09-01 ENCOUNTER — Encounter (INDEPENDENT_AMBULATORY_CARE_PROVIDER_SITE_OTHER): Payer: Self-pay | Admitting: Family Medicine

## 2020-09-01 ENCOUNTER — Other Ambulatory Visit: Payer: Self-pay

## 2020-09-01 ENCOUNTER — Ambulatory Visit (INDEPENDENT_AMBULATORY_CARE_PROVIDER_SITE_OTHER): Payer: BC Managed Care – PPO | Admitting: Family Medicine

## 2020-09-01 VITALS — BP 123/76 | HR 70 | Temp 97.4°F | Ht 66.0 in | Wt 228.0 lb

## 2020-09-01 DIAGNOSIS — Z6836 Body mass index (BMI) 36.0-36.9, adult: Secondary | ICD-10-CM

## 2020-09-01 DIAGNOSIS — E782 Mixed hyperlipidemia: Secondary | ICD-10-CM

## 2020-09-01 DIAGNOSIS — E8881 Metabolic syndrome: Secondary | ICD-10-CM

## 2020-09-01 DIAGNOSIS — Z9189 Other specified personal risk factors, not elsewhere classified: Secondary | ICD-10-CM | POA: Diagnosis not present

## 2020-09-01 DIAGNOSIS — E559 Vitamin D deficiency, unspecified: Secondary | ICD-10-CM

## 2020-09-01 MED ORDER — VITAMIN D (ERGOCALCIFEROL) 1.25 MG (50000 UNIT) PO CAPS: 50000.0000 [IU] | ORAL_CAPSULE | ORAL | 0 refills | Status: DC

## 2020-09-02 NOTE — Progress Notes (Signed)
Chief Complaint:   OBESITY Stacey Garner is here to discuss her progress with her obesity treatment plan along with follow-up of her obesity related diagnoses. Stacey Garner is on the Category 2 Plan + 100 calories and states she is following her eating plan approximately 60% of the time. Stacey Garner states she is doing 0 minutes 0 times per week.  Today's visit was #: 2 Starting weight: 228 lbs Starting date: 08/18/2020 Today's weight: 228 lbs Today's date: 09/01/2020 Total lbs lost to date: 0 Total lbs lost since last in-office visit: 0  Interim History: Stacey Garner was on vacation some of the time since her last visit. She tried to follow her plan otherwise, and she did well avoiding vacation weight gain.  Subjective:   1. Mixed hyperlipidemia Stacey Garner's last LDL and triglycerides are elevated, and HDL is low. She is not on statin. She notes more simples carbohydrates in the past, but she is working on diet and weight loss. I discussed labs with the patient today.  2. Vitamin D deficiency Stacey Garner's Vit D level is low, and she is not on Vit D. She gets a normal amount of sun exposure. She notes fatigue. I discussed labs with the patient today.  3. Insulin resistance Stacey Garner has a new diagnosis of insulin resistance. Her fasting insulin is mildly elevated but her A1c and fasting glucose are within normal limits. Her hunger has decreased with decreasing simple carbohydrates. I discussed labs with the patient today.  4. At risk for heart disease Stacey Garner is at a higher than average risk for cardiovascular disease due to obesity.   Assessment/Plan:   1. Mixed hyperlipidemia Cardiovascular risk and specific lipid/LDL goals reviewed. We discussed several lifestyle modifications today. Stacey Garner will continue to work on diet, exercise and weight loss efforts. We will recheck labs in 3 months. Orders and follow up as documented in patient record.   2. Vitamin D deficiency Low Vitamin D level contributes to fatigue and are  associated with obesity, breast, and colon cancer. Stacey Garner agreed to start prescription Vitamin D 50,000 IU every week with no refills. She will follow-up for routine testing of Vitamin D, at least 2-3 times per year to avoid over-replacement.  - Vitamin D, Ergocalciferol, (DRISDOL) 1.25 MG (50000 UNIT) CAPS capsule; Take 1 capsule (50,000 Units total) by mouth every 7 (seven) days.  Dispense: 4 capsule; Refill: 0  3. Insulin resistance Stacey Garner will continue to work on weight loss, exercise, and decreasing simple carbohydrates to help decrease the risk of diabetes. We will recheck labs in 3 months. Stacey Garner agreed to follow-up with [place] as directed to closely monitor her progress.  4. At risk for heart disease Stacey Garner was given approximately 30 minutes of coronary artery disease prevention counseling today. She is 50 y.o. female and has risk factors for heart disease including obesity. We discussed intensive lifestyle modifications today with an emphasis on specific weight loss instructions and strategies.   Repetitive spaced learning was employed today to elicit superior memory formation and behavioral change.  5. Obesity with current BMI of 36.8 Stacey Garner is currently in the action stage of change. As such, her goal is to continue with weight loss efforts. She has agreed to the Category 2 Plan or keeping a food journal and adhering to recommended goals of 1200-1500 calories and 80+ grams of protein daily.   Behavioral modification strategies: increasing lean protein intake and keeping a strict food journal.  Stacey Garner has agreed to follow-up with our clinic in 2 weeks. She  was informed of the importance of frequent follow-up visits to maximize her success with intensive lifestyle modifications for her multiple health conditions.   Objective:   Blood pressure 123/76, pulse 70, temperature (!) 97.4 F (36.3 C), height 5\' 6"  (1.676 m), weight 228 lb (103.4 kg), SpO2 97 %. Body mass index is 36.8  kg/m.  General: Cooperative, alert, well developed, in no acute distress. HEENT: Conjunctivae and lids unremarkable. Cardiovascular: Regular rhythm.  Lungs: Normal work of breathing. Neurologic: No focal deficits.   Lab Results  Component Value Date   CREATININE 0.79 08/18/2020   BUN 12 08/18/2020   NA 139 08/18/2020   K 4.3 08/18/2020   CL 99 08/18/2020   CO2 22 08/18/2020   Lab Results  Component Value Date   ALT 17 08/18/2020   AST 18 08/18/2020   ALKPHOS 77 08/18/2020   BILITOT 0.5 08/18/2020   Lab Results  Component Value Date   HGBA1C 5.4 08/18/2020   Lab Results  Component Value Date   INSULIN 7.5 08/18/2020   Lab Results  Component Value Date   TSH 2.090 08/18/2020   Lab Results  Component Value Date   CHOL 224 (H) 08/18/2020   HDL 39 (L) 08/18/2020   LDLCALC 141 (H) 08/18/2020   LDLDIRECT 50 12/27/2012   LDLDIRECT 50 12/27/2012   TRIG 244 (H) 08/18/2020   CHOLHDL 2.7 12/27/2012   Lab Results  Component Value Date   WBC 6.9 08/18/2020   HGB 15.7 08/18/2020   HCT 46.5 08/18/2020   MCV 90 08/18/2020   PLT 158 08/18/2020   No results found for: IRON, TIBC, FERRITIN  Attestation Statements:   Reviewed by clinician on day of visit: allergies, medications, problem list, medical history, surgical history, family history, social history, and previous encounter notes.   I, 08/20/2020, am acting as transcriptionist for Burt Knack, MD.  I have reviewed the above documentation for accuracy and completeness, and I agree with the above. -  Quillian Quince, MD

## 2020-09-20 ENCOUNTER — Encounter (INDEPENDENT_AMBULATORY_CARE_PROVIDER_SITE_OTHER): Payer: Self-pay | Admitting: Physician Assistant

## 2020-09-20 ENCOUNTER — Ambulatory Visit (INDEPENDENT_AMBULATORY_CARE_PROVIDER_SITE_OTHER): Payer: BC Managed Care – PPO | Admitting: Physician Assistant

## 2020-09-20 ENCOUNTER — Other Ambulatory Visit: Payer: Self-pay

## 2020-09-20 VITALS — BP 125/78 | HR 77 | Temp 98.0°F | Ht 66.0 in | Wt 225.0 lb

## 2020-09-20 DIAGNOSIS — E559 Vitamin D deficiency, unspecified: Secondary | ICD-10-CM

## 2020-09-20 DIAGNOSIS — E8881 Metabolic syndrome: Secondary | ICD-10-CM

## 2020-09-20 DIAGNOSIS — Z6836 Body mass index (BMI) 36.0-36.9, adult: Secondary | ICD-10-CM

## 2020-09-20 DIAGNOSIS — Z9189 Other specified personal risk factors, not elsewhere classified: Secondary | ICD-10-CM

## 2020-09-20 MED ORDER — VITAMIN D (ERGOCALCIFEROL) 1.25 MG (50000 UNIT) PO CAPS: 50000.0000 [IU] | ORAL_CAPSULE | ORAL | 0 refills | Status: DC

## 2020-09-20 NOTE — Progress Notes (Signed)
Chief Complaint:   OBESITY Stacey Garner is here to discuss her progress with her obesity treatment plan along with follow-up of her obesity related diagnoses. Stacey Garner is on the Category 2 Plan or keeping a food journal and adhering to recommended goals of 1200-1500 calories and 80+ grams of protein daily and states she is following her eating plan approximately 40% of the time. Stacey Garner states she is doing 0 minutes 0 times per week.  Today's visit was #: 3 Starting weight: 228 lbs Starting date: 08/18/2020 Today's weight: 225 lbs Today's date: 09/20/2020 Total lbs lost to date: 3 Total lbs lost since last in-office visit: 3  Interim History: Stacey Garner is journaling most days. She is averaging between 1400-1600 calories and 80-85 grams of protein daily. She had a lot of celebrations in the last 2 weeks.  Subjective:   1. Vitamin D deficiency [name] is on Vit D weekly, and her last level was 20.0.  2. Insulin resistance Stacey Garner is not on medications, and she denies polyphagia.  3. At risk for diabetes mellitus Stacey Garner is at higher than average risk for developing diabetes due to obesity.   Assessment/Plan:   1. Vitamin D deficiency Low Vitamin D level contributes to fatigue and are associated with obesity, breast, and colon cancer. We will refill prescription Vitamin D for 1 month. Stacey Garner will follow-up for routine testing of Vitamin D, at least 2-3 times per year to avoid over-replacement.  - Vitamin D, Ergocalciferol, (DRISDOL) 1.25 MG (50000 UNIT) CAPS capsule; Take 1 capsule (50,000 Units total) by mouth every 7 (seven) days.  Dispense: 4 capsule; Refill: 0  2. Insulin resistance Stacey Garner will continue her meal plan, and will continue to work on weight loss, exercise, and decreasing simple carbohydrates to help decrease the risk of diabetes. Honey agreed to follow-up with [place] as directed to closely monitor her progress.  3. At risk for diabetes mellitus Stacey Garner was given approximately 15 minutes of  diabetes education and counseling today. We discussed intensive lifestyle modifications today with an emphasis on weight loss as well as increasing exercise and decreasing simple carbohydrates in her diet. We also reviewed medication options with an emphasis on risk versus benefit of those discussed.   Repetitive spaced learning was employed today to elicit superior memory formation and behavioral change.  4. BMI 36.0-36.9,adult Stacey Garner is currently in the action stage of change. As such, her goal is to continue with weight loss efforts. She has agreed to the Category 2 Plan or keeping a food journal and adhering to recommended goals of 1200-1400 calories and 80+ grams of protein daily.   Exercise goals: No exercise has been prescribed at this time.  Behavioral modification strategies: meal planning and cooking strategies and keeping healthy foods in the home.  Stacey Garner has agreed to follow-up with our clinic in 2 weeks. She was informed of the importance of frequent follow-up visits to maximize her success with intensive lifestyle modifications for her multiple health conditions.   Objective:   Blood pressure 125/78, pulse 77, temperature 98 F (36.7 C), temperature source Oral, height 5\' 6"  (1.676 m), weight 225 lb (102.1 kg), SpO2 96 %. Body mass index is 36.32 kg/m.  General: Cooperative, alert, well developed, in no acute distress. HEENT: Conjunctivae and lids unremarkable. Cardiovascular: Regular rhythm.  Lungs: Normal work of breathing. Neurologic: No focal deficits.   Lab Results  Component Value Date   CREATININE 0.79 08/18/2020   BUN 12 08/18/2020   NA 139 08/18/2020  K 4.3 08/18/2020   CL 99 08/18/2020   CO2 22 08/18/2020   Lab Results  Component Value Date   ALT 17 08/18/2020   AST 18 08/18/2020   ALKPHOS 77 08/18/2020   BILITOT 0.5 08/18/2020   Lab Results  Component Value Date   HGBA1C 5.4 08/18/2020   Lab Results  Component Value Date   INSULIN 7.5  08/18/2020   Lab Results  Component Value Date   TSH 2.090 08/18/2020   Lab Results  Component Value Date   CHOL 224 (H) 08/18/2020   HDL 39 (L) 08/18/2020   LDLCALC 141 (H) 08/18/2020   LDLDIRECT 50 12/27/2012   LDLDIRECT 50 12/27/2012   TRIG 244 (H) 08/18/2020   CHOLHDL 2.7 12/27/2012   Lab Results  Component Value Date   WBC 6.9 08/18/2020   HGB 15.7 08/18/2020   HCT 46.5 08/18/2020   MCV 90 08/18/2020   PLT 158 08/18/2020   No results found for: IRON, TIBC, FERRITIN  Attestation Statements:   Reviewed by clinician on day of visit: allergies, medications, problem list, medical history, surgical history, family history, social history, and previous encounter notes.   Trude Mcburney, am acting as transcriptionist for [employer], PA-C.  I have reviewed the above documentation for accuracy and completeness, and I agree with the above. Alois Cliche, PA-C

## 2020-10-12 ENCOUNTER — Other Ambulatory Visit: Payer: Self-pay

## 2020-10-12 ENCOUNTER — Encounter (INDEPENDENT_AMBULATORY_CARE_PROVIDER_SITE_OTHER): Payer: Self-pay | Admitting: Physician Assistant

## 2020-10-12 ENCOUNTER — Ambulatory Visit (INDEPENDENT_AMBULATORY_CARE_PROVIDER_SITE_OTHER): Payer: BC Managed Care – PPO | Admitting: Physician Assistant

## 2020-10-12 VITALS — BP 115/73 | HR 78 | Temp 98.4°F | Ht 66.0 in | Wt 223.0 lb

## 2020-10-12 DIAGNOSIS — Z9189 Other specified personal risk factors, not elsewhere classified: Secondary | ICD-10-CM | POA: Diagnosis not present

## 2020-10-12 DIAGNOSIS — E559 Vitamin D deficiency, unspecified: Secondary | ICD-10-CM

## 2020-10-12 DIAGNOSIS — Z6836 Body mass index (BMI) 36.0-36.9, adult: Secondary | ICD-10-CM | POA: Diagnosis not present

## 2020-10-12 DIAGNOSIS — E782 Mixed hyperlipidemia: Secondary | ICD-10-CM | POA: Diagnosis not present

## 2020-10-12 MED ORDER — VITAMIN D (ERGOCALCIFEROL) 1.25 MG (50000 UNIT) PO CAPS: 50000.0000 [IU] | ORAL_CAPSULE | ORAL | 0 refills | Status: DC

## 2020-10-20 NOTE — Progress Notes (Signed)
Chief Complaint:   OBESITY Stacey Garner is here to discuss her progress with her obesity treatment plan along with follow-up of her obesity related diagnoses. Stacey Garner is on the Category 2 Plan or keeping a food journal and adhering to recommended goals of 1200-1400 calories and 80+ grams of protein daily and states she is following her eating plan approximately 60% of the time. Stacey Garner states she is doing 0 minutes 0 times per week.  Today's visit was #: 4 Starting weight: 228 lbs Starting date: 08/18/2020 Today's weight: 223 lbs Today's date: 10/12/2020 Total lbs lost to date: 5 Total lbs lost since last in-office visit: 2  Interim History: Stacey Garner has had multiple celebrations the last few weeks and she has been indulging in pasta, potatoes, and bread, but she is doing a better job with portion control. She is averaging 80+ grams of protein and 1300-1500 calories per daily. She wants to start exercising.  Subjective:   1. Vitamin D deficiency Stacey Garner is on Vit D, and she is tolerating it well.  2. Mixed hyperlipidemia Stacey Garner's last lipid panel was not at goal. She is not on medications, and she is managed by Cardiology.  3. At risk for osteoporosis Stacey Garner is at higher risk of osteopenia and osteoporosis due to Vitamin D deficiency.   Assessment/Plan:   1. Vitamin D deficiency Low Vitamin D level contributes to fatigue and are associated with obesity, breast, and colon cancer. We will refill prescription Vitamin D for 1 month. Stacey Garner will follow-up for routine testing of Vitamin D, at least 2-3 times per year to avoid over-replacement.  - Vitamin D, Ergocalciferol, (DRISDOL) 1.25 MG (50000 UNIT) CAPS capsule; Take 1 capsule (50,000 Units total) by mouth every 7 (seven) days.  Dispense: 4 capsule; Refill: 0  2. Mixed hyperlipidemia Cardiovascular risk and specific lipid/LDL goals reviewed. We discussed several lifestyle modifications today. Stacey Garner will continue her meal plan, and will start  exercising. She will continue to follow up with Cardiology. Orders and follow up as documented in patient record.   Counseling Intensive lifestyle modifications are the first line treatment for this issue. Dietary changes: Increase soluble fiber. Decrease simple carbohydrates. Exercise changes: Moderate to vigorous-intensity aerobic activity 150 minutes per week if tolerated. Lipid-lowering medications: see documented in medical record.  3. At risk for osteoporosis Stacey Garner was given approximately 15 minutes of osteoporosis prevention counseling today. Stacey Garner is at risk for osteopenia and osteoporosis due to her Vitamin D deficiency. She was encouraged to take her Vitamin D and follow her higher calcium diet and increase strengthening exercise to help strengthen her bones and decrease her risk of osteopenia and osteoporosis.  Repetitive spaced learning was employed today to elicit superior memory formation and behavioral change.  4. BMI 36.0-36.9,adult Stacey Garner is currently in the action stage of change. As such, her goal is to continue with weight loss efforts. She has agreed to keeping a food journal and adhering to recommended goals of 1200-1400 calories and 85 grams of protein daily.   Exercise goals: No exercise has been prescribed at this time.  Behavioral modification strategies: meal planning and cooking strategies and celebration eating strategies.  Stacey Garner has agreed to follow-up with our clinic in 3 weeks. She was informed of the importance of frequent follow-up visits to maximize her success with intensive lifestyle modifications for her multiple health conditions.   Objective:   Blood pressure 115/73, pulse 78, temperature 98.4 F (36.9 C), height 5\' 6"  (1.676 m), weight 223 lb (101.2 kg),  SpO2 96 %. Body mass index is 35.99 kg/m.  General: Cooperative, alert, well developed, in no acute distress. HEENT: Conjunctivae and lids unremarkable. Cardiovascular: Regular rhythm.  Lungs:  Normal work of breathing. Neurologic: No focal deficits.   Lab Results  Component Value Date   CREATININE 0.79 08/18/2020   BUN 12 08/18/2020   NA 139 08/18/2020   K 4.3 08/18/2020   CL 99 08/18/2020   CO2 22 08/18/2020   Lab Results  Component Value Date   ALT 17 08/18/2020   AST 18 08/18/2020   ALKPHOS 77 08/18/2020   BILITOT 0.5 08/18/2020   Lab Results  Component Value Date   HGBA1C 5.4 08/18/2020   Lab Results  Component Value Date   INSULIN 7.5 08/18/2020   Lab Results  Component Value Date   TSH 2.090 08/18/2020   Lab Results  Component Value Date   CHOL 224 (H) 08/18/2020   HDL 39 (L) 08/18/2020   LDLCALC 141 (H) 08/18/2020   LDLDIRECT 50 12/27/2012   LDLDIRECT 50 12/27/2012   TRIG 244 (H) 08/18/2020   CHOLHDL 2.7 12/27/2012   Lab Results  Component Value Date   WBC 6.9 08/18/2020   HGB 15.7 08/18/2020   HCT 46.5 08/18/2020   MCV 90 08/18/2020   PLT 158 08/18/2020   No results found for: IRON, TIBC, FERRITIN  Attestation Statements:   Reviewed by clinician on day of visit: allergies, medications, problem list, medical history, surgical history, family history, social history, and previous encounter notes.   Trude Mcburney, am acting as transcriptionist for [employer], PA-C.  I have reviewed the above documentation for accuracy and completeness, and I agree with the above. Alois Cliche, PA-C

## 2020-11-03 ENCOUNTER — Ambulatory Visit (INDEPENDENT_AMBULATORY_CARE_PROVIDER_SITE_OTHER): Payer: BC Managed Care – PPO | Admitting: Physician Assistant

## 2020-11-15 ENCOUNTER — Ambulatory Visit (INDEPENDENT_AMBULATORY_CARE_PROVIDER_SITE_OTHER): Payer: BC Managed Care – PPO | Admitting: Physician Assistant

## 2020-12-03 ENCOUNTER — Encounter (INDEPENDENT_AMBULATORY_CARE_PROVIDER_SITE_OTHER): Payer: Self-pay | Admitting: Physician Assistant

## 2020-12-06 ENCOUNTER — Ambulatory Visit (INDEPENDENT_AMBULATORY_CARE_PROVIDER_SITE_OTHER): Payer: BC Managed Care – PPO | Admitting: Physician Assistant

## 2020-12-18 ENCOUNTER — Other Ambulatory Visit (INDEPENDENT_AMBULATORY_CARE_PROVIDER_SITE_OTHER): Payer: Self-pay | Admitting: Physician Assistant

## 2020-12-18 DIAGNOSIS — E559 Vitamin D deficiency, unspecified: Secondary | ICD-10-CM

## 2020-12-20 NOTE — Telephone Encounter (Signed)
Stacey Garner 

## 2021-06-17 ENCOUNTER — Encounter: Payer: Self-pay | Admitting: Internal Medicine

## 2021-06-17 ENCOUNTER — Other Ambulatory Visit: Payer: Self-pay

## 2021-06-17 ENCOUNTER — Ambulatory Visit: Payer: BC Managed Care – PPO | Admitting: Internal Medicine

## 2021-06-17 VITALS — BP 131/81 | HR 75 | Ht 65.5 in | Wt 232.8 lb

## 2021-06-17 DIAGNOSIS — I252 Old myocardial infarction: Secondary | ICD-10-CM | POA: Diagnosis not present

## 2021-06-17 DIAGNOSIS — E559 Vitamin D deficiency, unspecified: Secondary | ICD-10-CM

## 2021-06-17 DIAGNOSIS — E785 Hyperlipidemia, unspecified: Secondary | ICD-10-CM

## 2021-06-17 DIAGNOSIS — I2542 Coronary artery dissection: Secondary | ICD-10-CM | POA: Diagnosis not present

## 2021-06-17 NOTE — Patient Instructions (Signed)
Medication Instructions:  Your physician recommends that you continue on your current medications as directed. Please refer to the Current Medication list given to you today.  *If you need a refill on your cardiac medications before your next appointment, please call your pharmacy*   Lab Work: FASTING lab work -- TSH, lipid, vit D  If you have labs (blood work) drawn today and your tests are completely normal, you will receive your results only by: MyChart Message (if you have MyChart) OR A paper copy in the mail If you have any lab test that is abnormal or we need to change your treatment, we will call you to review the results.   Testing/Procedures: NONE   Follow-Up: At [facility], you and your health needs are our priority.  As part of our continuing mission to provide you with exceptional heart care, we have created designated Provider Care Teams.  These Care Teams include your primary Cardiologist (physician) and Advanced Practice Providers (APPs -  Physician Assistants and Nurse Practitioners) who all work together to provide you with the care you need, when you need it.  We recommend signing up for the patient portal called "MyChart".  Sign up information is provided on this After Visit Summary.  MyChart is used to connect with patients for Virtual Visits (Telemedicine).  Patients are able to view lab/test results, encounter notes, upcoming appointments, etc.  Non-urgent messages can be sent to your provider as well.   To learn more about what you can do with MyChart, go to [email or web address].    Your next appointment:   6 month(s)  The format for your next appointment:   In Person  Provider:   Dr. [name] Shutter

## 2021-06-17 NOTE — Progress Notes (Signed)
OFFICE NOTE  Chief Complaint:  Established cardiologist  Primary Care Physician: Stacey Honey, MD  HPI:  Stacey Garner is a 51 y.o. 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  She also has a family history of coronary disease, dyslipidemia and vitamin D deficiency.  She was previously followed by primary care provider who retired.  She has not had primary care for about a year.  She is now reestablishing with cardiology due to concern for coronary risk.  She is asymptomatic and generally denies any shortness of breath or chest pain when she exercises.  She was also being seen in the weight management clinic by Dr. Dalbert Garner but cannot commit to following up with the program.  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  She had recent labs earlier this past year showing total cholesterol 224, triglycerides 244 HDL 39 and LDL 141.  She also at a low vitamin D level at 20.  She was prescribed 50,000 unit vitamin D which she completed but the prescription ran out and she did not stay on any oral therapies.  PMHx:  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   To OM1 in setting of NSTEMI   H/O blood clots    History of MI (myocardial infarction)    Hyperlipidemia    Nearsightedness    wears glasses sometimes   Other fatigue    Scoliosis    mild   Shortness of breath    Shortness of breath on exertion    Vitamin D deficiency     Past Surgical History:  Procedure Laterality Date   CARDIAC CATHETERIZATION  08/13/2012   50-60% prox-mid OM1 narrowing and appearance c/w coronary dissection; LVEF 55-65%    LEEP  1995   LEFT HEART CATHETERIZATION WITH CORONARY ANGIOGRAM N/A 08/13/2012   Procedure: LEFT HEART CATHETERIZATION WITH CORONARY ANGIOGRAM;  Surgeon: Stacey M [place], MD;  Location: [facility] CATH LAB;  Service: Cardiovascular;  Laterality: N/A;   TONSILLECTOMY     WISDOM TOOTH EXTRACTION      FAMHx:  Family History  Problem Relation Age of Onset   Osteoporosis Mother    Scoliosis Mother    Hyperlipidemia Mother    Alcohol abuse Father        died of ETOH abuse   Cancer Father        possible pancreatic  cancer   Liver disease Father    Drug abuse Father    Thyroid disease Maternal Aunt    Stroke Paternal Uncle    Cancer Maternal Grandmother 68       ovarian   Heart disease Paternal Grandmother    Diabetes Neg Hx     SOCHx:   reports that she quit smoking about 22 years ago. Her smoking use included cigarettes. She has never used smokeless tobacco. She reports that she does not drink alcohol and does not use drugs.  ALLERGIES:  No Known Allergies  ROS: Pertinent items noted in HPI and remainder of comprehensive ROS otherwise negative.  HOME MEDS: Current Outpatient Medications on File Prior to Visit  Medication Sig Dispense  Refill   Vitamin D, Ergocalciferol, (DRISDOL) 1.25 MG (50000 UNIT) CAPS capsule Take 1 capsule (50,000 Units total) by mouth every 7 (seven) days. (Patient not taking: Reported on 06/17/2021) 4 capsule 0   No current facility-administered medications on file prior to visit.    LABS/IMAGING: No results found for this or any previous visit (from the past 48 hour(s)). No results found.  LIPID PANEL:    Component Value Date/Time   CHOL 224 (H) 08/18/2020 0958   TRIG 244 (H) 08/18/2020 0958   HDL 39 (L) 08/18/2020 0958   CHOLHDL 2.7 12/27/2012 0840   CHOLHDL 5.4 08/13/2012 0500   VLDL 19 08/13/2012 0500   LDLCALC 141 (H) 08/18/2020 0958   LDLDIRECT 50 12/27/2012 0840   LDLDIRECT 50 12/27/2012 0840     WEIGHTS: Wt Readings from Last 3 Encounters:   06/17/21 232 lb 12.8 oz (105.6 kg)  10/12/20 223 lb (101.2 kg)  09/20/20 225 lb (102.1 kg)    VITALS: BP 131/81 (BP Location: Left Arm, Patient Position: Sitting, Cuff Size: Large)    Pulse 75    Ht 5' 5.5" (1.664 m)    Wt 232 lb 12.8 oz (105.6 kg)    SpO2 97%    BMI 38.15 kg/m   EXAM: General appearance: alert, no distress, and morbidly obese Neck: no carotid bruit, no JVD, and thyroid not enlarged, symmetric, no tenderness/mass/nodules Lungs: clear to auscultation bilaterally Heart: regular rate and rhythm, S1, S2 normal, no murmur, click, rub or gallop Abdomen: soft, non-tender; bowel sounds normal; no masses,  no organomegaly Extremities: extremities normal, atraumatic, no cyanosis or edema Pulses: 2+ and symmetric Skin: Skin color, texture, turgor normal. No rashes or lesions Neurologic: Grossly normal Psych: Pleasant  Examination was chaperoned by Stacey Fusi, RN.   EKG: Normal sinus rhythm at 75- personally reviewed  ASSESSMENT: History of NSTEMI with scad (2014) of the OM1 Dyslipidemia Vitamin D deficiency Morbid obesity  PLAN: 1.   [name] has a history of NSTEMI with scad of an OM vessel back in 2014 in her 30s.  She also has a strong family history of heart disease.  She is interested in reestablishing care.  She is asymptomatic at this time.  She has a dyslipidemia but is not on therapy.  We will go ahead and repeat labs as they are close to a-year-old as well as recheck her vitamin D levels and likely she will need treatment for that.  She will need to continue to work on weight loss and previously been seen in the weight management clinic however her schedule does not allow that.  Plan follow-up with me in 6 months or sooner as necessary  Stacey Nose, MD, [facility], FACP  [place]   [facility]  Medical Director of the Advanced Lipid Disorders &  Cardiovascular Risk Reduction Clinic Diplomate of the American Board of Clinical Lipidology Attending  Cardiologist  Direct Dial: [phone]   Fax: [phone]  Website:  [email or web address] 06/17/2021, 4:22 PM

## 2021-12-14 ENCOUNTER — Encounter (INDEPENDENT_AMBULATORY_CARE_PROVIDER_SITE_OTHER): Payer: Self-pay

## 2022-02-09 ENCOUNTER — Ambulatory Visit: Payer: BC Managed Care – PPO | Admitting: Pulmonary Disease

## 2022-02-09 ENCOUNTER — Encounter: Payer: Self-pay | Admitting: Pulmonary Disease

## 2022-02-09 VITALS — BP 116/76 | HR 75 | Ht 65.0 in | Wt 227.5 lb

## 2022-02-09 DIAGNOSIS — J45909 Unspecified asthma, uncomplicated: Secondary | ICD-10-CM | POA: Diagnosis not present

## 2022-02-09 MED ORDER — FLUTICASONE-SALMETEROL 500-50 MCG/ACT IN AEPB: 1.0000 | INHALATION_SPRAY | Freq: Two times a day (BID) | RESPIRATORY_TRACT | 3 refills | Status: DC

## 2022-02-09 NOTE — Progress Notes (Signed)
@Patient  ID: Stacey Garner, female    DOB: [birth year], 51 y.o.   MRN: [id number]  Chief Complaint  Patient presents with   Consult    Consult for cough/wheezing.Cough and wheezing have both subsided. Pt states that she was exposed to asbestos. Cough and wheeze started in May. 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 Does have albuterol and she states that when she did take it she felt better.     Referring provider: Elinor Parkinson  HPI:   51 y.o. woman whom are seen in consultation for evaluation of cough, wheezing, shortness of breath.  Note from referring provider reviewed.  Patient reports onset of symptoms May 2023.  Persisted through early June.  Chest tightness, shortness of breath.  Occasional cough.  Associated wheeze.  Intermittently would come and go.  Was given albuterol.  This helped.  Relieve symptoms for a period of time.  But symptoms have recurred.  She works for [employer].  Off for the summer.  No issues.  No symptoms.  Symptoms essentially resolved.  Symptoms have since recurred since return to the workplace at school.  She notes remediation of asbestos at work as well as possibly moving mold.  Basically certainly stirring up dust etc. with the work.  Likely stirring up allergens as well.  She reports triggers such as smells etc.  No issues with asthma earlier in life.  No issues of breathing prior to this.  Chest x-ray for work-up of this 01/2022 personally reviewed and interpreted as clear lungs bilaterally.  PMH: Hypertension, CAD, hyperlipidemia Surgical history: Reviewed, denies any Family history: Mother hyperlipidemia, father with liver disease, alcohol abuse Social history: Former smoker, quit 2000, works regular [employer], lives in [place] / Pulmonary Flowsheets:   ACT:      No data to display          MMRC:     No data to display          Epworth:      No data  to display          Tests:   FENO:  No results found for: "NITRICOXIDE"  PFT:     No data to display          WALK:      No data to display          Imaging: Personally reviewed and as per EMR and discussion in this note No results found.  Lab Results: Personally reviewed CBC    Component Value Date/Time   WBC 6.9 08/18/2020 0958   WBC 5.7 08/14/2012 0438   RBC 5.15 08/18/2020 0958   RBC 4.75 08/14/2012 0438   HGB 15.7 08/18/2020 0958   HCT 46.5 08/18/2020 0958   PLT 158 08/18/2020 0958   MCV 90 08/18/2020 0958   MCH 30.5 08/18/2020 0958   MCH 30.5 08/14/2012 0438   MCHC 33.8 08/18/2020 0958   MCHC 34.9 08/14/2012 0438   RDW 12.3 08/18/2020 0958   LYMPHSABS 1.2 08/18/2020 0958   MONOABS 0.4 03/26/2012 1045   EOSABS 0.1 08/18/2020 0958   BASOSABS 0.0 08/18/2020 0958    BMET    Component Value Date/Time   NA 139 08/18/2020 0958   K 4.3 08/18/2020 0958   CL 99 08/18/2020 0958   CO2 22 08/18/2020 0958   GLUCOSE 86 08/18/2020 0958   GLUCOSE 75 08/28/2012 0958   BUN 12  08/18/2020 0958   CREATININE 0.79 08/18/2020 0958   CREATININE 0.73 08/28/2012 0958   CALCIUM 8.9 08/18/2020 0958   GFRNONAA >90 08/12/2012 1308   GFRAA >90 08/12/2012 1308    BNP No results found for: "BNP"  ProBNP    Component Value Date/Time   PROBNP 11.2 08/12/2012 1308    Specialty Problems   None   No Known Allergies  Immunization History  Administered Date(s) Administered   Hepatitis A, Adult 10/21/2014   Hepatitis A, Ped/Adol-2 Dose 10/21/2014   PFIZER Comirnaty(Gray Top)Covid-19 Tri-Sucrose Vaccine 07/05/2019, 07/29/2019   PFIZER(Purple Top)SARS-COV-2 Vaccination 07/05/2019, 07/29/2019   Tdap 10/21/2014, 07/20/2016   Yellow Fever 10/02/2014    Past Medical History:  Diagnosis Date   Asthma    Bilateral swelling of feet and ankles    CAD (coronary artery disease) 08/13/2012   One-vessel CAD (50-60% OM1)   Chest pain    Congenital hearing disorder     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   To OM1 in setting of NSTEMI   H/O blood clots    History of MI (myocardial infarction)    Hyperlipidemia    Nearsightedness    wears glasses sometimes   Other fatigue    Scoliosis    mild   Shortness of breath    Shortness of breath on exertion    Vitamin D deficiency     Tobacco History: Social History   Tobacco Use  Smoking Status Former   Types: Cigarettes   Quit date: 07/13/1998   Years since quitting: 23.5  Smokeless Tobacco Never   Counseling given: Not Answered   Continue to not smoke  Outpatient Encounter Medications as of 02/09/2022  Medication Sig   albuterol (VENTOLIN HFA) 108 (90 Base) MCG/ACT inhaler Inhale into the lungs.   fluticasone-salmeterol (ADVAIR DISKUS) 500-50 MCG/ACT AEPB Inhale 1 puff into the lungs in the morning and at bedtime.   Vitamin D, Ergocalciferol, (DRISDOL) 1.25 MG (50000 UNIT) CAPS capsule Take 1 capsule (50,000 Units total) by mouth every 7 (seven) days.   No facility-administered encounter medications on file as of 02/09/2022.     Review of Systems  Review of Systems  No chest pain with exertion.  No orthopnea or PND.  Comprehensive review systems otherwise negative. Physical Exam  BP 116/76 (BP Location: Left Arm, Patient Position: Sitting, Cuff Size: Normal)   Pulse 75   Ht 5\' 5"  (1.651 m)   Wt 227 lb 8 oz (103.2 kg)   SpO2 98%   BMI 37.86 kg/m   Wt Readings from Last 5 Encounters:  02/09/22 227 lb 8 oz (103.2 kg)  06/17/21 232 lb 12.8 oz (105.6 kg)  10/12/20 223 lb (101.2 kg)  09/20/20 225 lb (102.1 kg)  09/01/20 228 lb (103.4 kg)    BMI Readings from Last 5 Encounters:  02/09/22 37.86 kg/m  06/17/21 38.15 kg/m  10/12/20 35.99 kg/m  09/20/20 36.32 kg/m  09/01/20 36.80 kg/m     Physical Exam General: Sitting in chair, no acute distress Eyes: EOMI, no icterus Neck: Supple, no JVP Pulmonary: Clear, normal work of breathing Cardiovascular warm, no edema Abdomen:  Nondistended, bowel sounds present MSK: No synovitis, no joint effusion Neuro: Normal gait, no weakness Psych: Normal mood, full affect   Assessment & Plan:   Shortness of breath, cough, wheeze: High suspicion for development of occupational asthma given lack of symptoms outside of the workplace.  Chest x-ray clear 01/2022.  Consider PFTs if lack of response to interventions below.  Occupational asthma:  Wheeze, cough, shortness of breath.  Exacerbated at work.  Improved with albuterol.  No symptoms during the summer away from school.  New prescription high-dose Advair discus 1 puff twice daily.  Continue albuterol as needed.   Return in about 3 months (around 05/12/2022).   Lanier Clam, MD 02/09/2022

## 2022-02-09 NOTE — Patient Instructions (Signed)
Nice to meet you  Your symptoms sound like asthma to me, provoked at your workplace or occupational asthma.  Your lungs are clear today and your chest x-ray was clear last month.  These are encouraging signs.  To treat this, take Advair 1 puff once a day twice a day.  Take this every day.  Rinse your mouth with water after every use.  Continue albuterol 2 puffs as needed for symptoms.  My hope is once you start Advair your need for the rescue inhaler albuterol will improve.  If the Advair inhaler is too expensive, please send a message and let me know and we will look for a more cost effective solution.  Return to clinic in 3 months or sooner as needed with Dr. Silas Flood

## 2022-06-09 ENCOUNTER — Telehealth: Payer: Self-pay | Admitting: Internal Medicine

## 2022-06-09 NOTE — Telephone Encounter (Signed)
  Pt is requesting to switch from Dr. Debara Pickett to [place]. she would like to have a female doctor  Please advise

## 2022-07-17 NOTE — Progress Notes (Signed)
Cardiology Office Note:    Date:  07/19/2022   ID:  Stacey Garner, DOB 06-Mar-1971, MRN [id number]  PCP:  Elinor Parkinson   [facility] Providers Cardiologist:  None { Referring MD: Larene Beach, MD    History of Present Illness:    Stacey Garner is a 52 y.o. female with a hx of NSTEMI secondary to SCAD to [place] in 2014, HLD and asthma who was previously followed by Dr. Debara Pickett who now presents to clinic for follow-up.  Was last seen in clinic by Dr. Debara Pickett on 06/2021 for CV screening. Was recommended for lipids at that time.  Today, the patient states that she overall feels well. Has occasional RUQ pain during stress but this improves with relaxation techniques. Has about 3-4 episodes per week. Symptoms are not exertional in nature and she feels better with exercise. Also has occasional flutters in her chest occur about 3-4x/week that last a couple of seconds at a time before resolving. No orthopnea, SOB, lightheadedness, dizziness or syncope. Was recently started on crestor for HLD.  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   To OM1 in setting of NSTEMI   H/O blood clots    History of MI (myocardial infarction)    Hyperlipidemia    Nearsightedness    wears glasses sometimes   Other fatigue    Scoliosis    mild   Shortness of breath    Shortness of breath on exertion    Vitamin D deficiency     Past Surgical History:  Procedure Laterality Date   CARDIAC CATHETERIZATION  08/13/2012   50-60% prox-mid OM1 narrowing and appearance c/w coronary dissection; LVEF 55-65%   LEEP  1995   LEFT HEART CATHETERIZATION WITH CORONARY ANGIOGRAM N/A 08/13/2012   Procedure: LEFT HEART CATHETERIZATION WITH CORONARY ANGIOGRAM;  Surgeon: Peter M Martinique, MD;  Location: [facility] CATH LAB;   Service: Cardiovascular;  Laterality: N/A;   TONSILLECTOMY     WISDOM TOOTH EXTRACTION      Current Medications: Current Meds  Medication Sig   albuterol (VENTOLIN HFA) 108 (90 Base) MCG/ACT inhaler Inhale into the lungs.   atovaquone-proguanil (MALARONE) 250-100 MG TABS tablet Take 1 tablet by mouth daily.   ciprofloxacin (CIPRO) 500 MG tablet Take 500 mg by mouth as needed. For travelling   rosuvastatin (CRESTOR) 10 MG tablet Take 10 mg by mouth daily.     Allergies:   Patient has no known allergies.   Social History   Socioeconomic History   Marital status: Unknown    Spouse name: Not on file   Number of children: 3   Years of education: Not on file   Highest education level: Not on file  Occupational History   Occupation: Education officer, museum    Employer: [employer]  Tobacco Use   Smoking status: Former    Types: Cigarettes    Quit date: 07/13/1998    Years since quitting: 24.0   Smokeless tobacco: Never  Vaping Use   Vaping Use: Never used  Substance and Sexual Activity   Alcohol use: No   Drug use: No   Sexual activity: Not Currently  Other Topics Concern   Not on file  Social History Narrative   Buddhist, divorced, 2 daughters,  age 57yo and 21yo, exercises 3 days per week 45 min each   Social Determinants of Health   Financial Resource Strain: Not on file  Food Insecurity: Not on file  Transportation Needs: Not on file  Physical Activity: Not on file  Stress: Not on file  Social Connections: Not on file     Family History: The patient's family history includes Alcohol abuse in her father; Cancer in her father; Cancer (age of onset: 35) in her maternal grandmother; Drug abuse in her father; Heart disease in her paternal grandmother; Hyperlipidemia in her mother; Liver disease in her father; Osteoporosis in her mother; Scoliosis in her mother; Stroke in her paternal uncle; Thyroid disease in her maternal aunt. There is no history of Diabetes.  ROS:    Please see the history of present illness.     All other systems reviewed and are negative.  EKGs/Labs/Other Studies Reviewed:    The following studies were reviewed today: No CV studies  EKG:  EKG is  ordered today.  The ekg ordered today demonstrates NSR with HR 78  Recent Labs: No results found for requested labs within last 365 days.  Recent Lipid Panel    Component Value Date/Time   CHOL 224 (H) 08/18/2020 0958   TRIG 244 (H) 08/18/2020 0958   HDL 39 (L) 08/18/2020 0958   CHOLHDL 2.7 12/27/2012 0840   CHOLHDL 5.4 08/13/2012 0500   VLDL 19 08/13/2012 0500   LDLCALC 141 (H) 08/18/2020 0958   LDLDIRECT 50 12/27/2012 0840   LDLDIRECT 50 12/27/2012 0840     Risk Assessment/Calculations:            Physical Exam:    VS:  BP (!) 128/92 (BP Location: Left Arm, Patient Position: Sitting, Cuff Size: Large)   Pulse 78   Ht '5\' 5"'$  (1.651 m)   Wt 241 lb 4.8 oz (109.5 kg)   BMI 40.15 kg/m     Wt Readings from Last 3 Encounters:  07/19/22 241 lb 4.8 oz (109.5 kg)  02/09/22 227 lb 8 oz (103.2 kg)  06/17/21 232 lb 12.8 oz (105.6 kg)     GEN:  Well nourished, well developed in no acute distress HEENT: Normal NECK: No JVD; No carotid bruits CARDIAC: RRR, no murmurs, rubs, gallops RESPIRATORY:  Clear to auscultation without rales, wheezing or rhonchi  ABDOMEN: Soft, non-tender, non-distended MUSCULOSKELETAL:  No edema; No deformity  SKIN: Warm and dry NEUROLOGIC:  Alert and oriented x 3 PSYCHIATRIC:  Normal affect   ASSESSMENT:    1. Spontaneous dissection of coronary artery   2. Hyperlipidemia, unspecified hyperlipidemia type   3. Vitamin D deficiency   4. History of non-ST elevation myocardial infarction (NSTEMI)   5. Mixed hyperlipidemia   6. Palpitations    PLAN:    In order of problems listed above:  #Palpitations: Patient with episodes of palpitations occurring 3-4x/week. Will check 3 day zio for further evaluation. -Check 3 day zio -Increase  hydration -Cut back on caffeine  #History of OM1 SCAD: Occurred in 2014. Was not pregnant at that time and unclear trigger. Currently with mild RUQ pain that is stress related  but no exertional symptoms. Will check TTE for further evaluation. -Check TTE  #HLD: -Check Ca score -Recently started on crestor '10mg'$  daily -Continue lifestyle modifications as below  Exercise recommendations: Goal of exercising for at least 30 minutes a day, at least 5 times per week.  Please exercise to a moderate exertion.  This means that while exercising  it is difficult to speak in full sentences, however you are not so short of breath that you feel you must stop, and not so comfortable that you can carry on a full conversation.  Exertion level should be approximately a 5/10, if 10 is the most exertion you can perform.  Diet recommendations: Recommend a heart healthy diet such as the Mediterranean diet.  This diet consists of plant based foods, healthy fats, lean meats, olive oil.  It suggests limiting the intake of simple carbohydrates such as white breads, pastries, and pastas.  It also limits the amount of red meat, wine, and dairy products such as cheese that one should consume on a daily basis.            Medication Adjustments/Labs and Tests Ordered: Current medicines are reviewed at length with the patient today.  Concerns regarding medicines are outlined above.  Orders Placed This Encounter  Procedures   CT CARDIAC SCORING (SELF PAY ONLY)   LONG TERM MONITOR (3-14 DAYS)   EKG 12-Lead   ECHOCARDIOGRAM COMPLETE   No orders of the defined types were placed in this encounter.   Patient Instructions  Medication Instructions:  Your physician recommends that you continue on your current medications as directed. Please refer to the Current Medication list given to you today.  *If you need a refill on your cardiac medications before your next appointment, please call your  pharmacy*   Testing/Procedures: Your physician has recommend you to have a coronary calcium score. This is a self pay test that will cost $99  Your physician has requested that you have an echocardiogram. Echocardiography is a painless test that uses sound waves to create images of your heart. It provides your doctor with information about the size and shape of your heart and how well your heart's chambers and valves are working. This procedure takes approximately one hour. There are no restrictions for this procedure. Please do NOT wear cologne, perfume, aftershave, or lotions (deodorant is allowed). Please arrive 15 minutes prior to your appointment time.  Your physician has recommended that you wear a Zio monitor.   This monitor is a medical device that records the heart's electrical activity. Doctors most often use these monitors to diagnose arrhythmias. Arrhythmias are problems with the speed or rhythm of the heartbeat. The monitor is a small device applied to your chest. You can wear one while you do your normal daily activities. While wearing this monitor if you have any symptoms to push the button and record what you felt. Once you have worn this monitor for the period of time provider prescribed (Usually 14 days), you will return the monitor device in the postage paid box. Once it is returned they will download the data collected and provide [place] with a report which the provider will then review and we will call you with those results. Important tips:  Avoid showering during the first 24 hours of wearing the monitor. Avoid excessive sweating to help maximize wear time. Do not submerge the device, no hot tubs, and no swimming pools. Keep any lotions or oils away from the patch. After 24 hours you may shower with the patch on. Take brief showers with your back facing the shower head.  Do not remove patch once it has been placed because that will interrupt data and decrease adhesive wear  time. Push the button when you have any symptoms and write down what you were feeling. Once you have completed wearing your monitor, remove and  place into box which has postage paid and place in your outgoing mailbox.  If for some reason you have misplaced your box then call our office and we can provide another box and/or mail it off for you  Follow-Up: At [facility], you and your health needs are our priority.  As part of our continuing mission to provide you with exceptional heart care, we have created designated Provider Care Teams.  These Care Teams include your primary Cardiologist (physician) and Advanced Practice Providers (APPs -  Physician Assistants and Nurse Practitioners) who all work together to provide you with the care you need, when you need it.  We recommend signing up for the patient portal called "MyChart".  Sign up information is provided on this After Visit Summary.  MyChart is used to connect with patients for Virtual Visits (Telemedicine).  Patients are able to view lab/test results, encounter notes, upcoming appointments, etc.  Non-urgent messages can be sent to your provider as well.   To learn more about what you can do with MyChart, go to [email or web address].    Your next appointment:   6 month(s)  Provider:   Gwyndolyn Kaufman, MD      Signed, Freada Bergeron, MD  07/19/2022 3:21 PM    [place]

## 2022-07-19 ENCOUNTER — Other Ambulatory Visit (HOSPITAL_BASED_OUTPATIENT_CLINIC_OR_DEPARTMENT_OTHER): Payer: BC Managed Care – PPO

## 2022-07-19 ENCOUNTER — Encounter (HOSPITAL_BASED_OUTPATIENT_CLINIC_OR_DEPARTMENT_OTHER): Payer: Self-pay | Admitting: Cardiology

## 2022-07-19 ENCOUNTER — Ambulatory Visit (HOSPITAL_BASED_OUTPATIENT_CLINIC_OR_DEPARTMENT_OTHER): Payer: BC Managed Care – PPO | Admitting: Cardiology

## 2022-07-19 VITALS — BP 128/92 | HR 78 | Ht 65.0 in | Wt 241.3 lb

## 2022-07-19 DIAGNOSIS — E785 Hyperlipidemia, unspecified: Secondary | ICD-10-CM

## 2022-07-19 DIAGNOSIS — E559 Vitamin D deficiency, unspecified: Secondary | ICD-10-CM

## 2022-07-19 DIAGNOSIS — R002 Palpitations: Secondary | ICD-10-CM

## 2022-07-19 DIAGNOSIS — I2542 Coronary artery dissection: Secondary | ICD-10-CM

## 2022-07-19 DIAGNOSIS — I252 Old myocardial infarction: Secondary | ICD-10-CM | POA: Diagnosis not present

## 2022-07-19 DIAGNOSIS — E782 Mixed hyperlipidemia: Secondary | ICD-10-CM

## 2022-07-19 NOTE — Patient Instructions (Signed)
Medication Instructions:  Your physician recommends that you continue on your current medications as directed. Please refer to the Current Medication list given to you today.  *If you need a refill on your cardiac medications before your next appointment, please call your pharmacy*   Testing/Procedures: Your physician has recommend you to have a coronary calcium score. This is a self pay test that will cost $99  Your physician has requested that you have an echocardiogram. Echocardiography is a painless test that uses sound waves to create images of your heart. It provides your doctor with information about the size and shape of your heart and how well your heart's chambers and valves are working. This procedure takes approximately one hour. There are no restrictions for this procedure. Please do NOT wear cologne, perfume, aftershave, or lotions (deodorant is allowed). Please arrive 15 minutes prior to your appointment time.  Your physician has recommended that you wear a Zio monitor.   This monitor is a medical device that records the heart's electrical activity. Doctors most often use these monitors to diagnose arrhythmias. Arrhythmias are problems with the speed or rhythm of the heartbeat. The monitor is a small device applied to your chest. You can wear one while you do your normal daily activities. While wearing this monitor if you have any symptoms to push the button and record what you felt. Once you have worn this monitor for the period of time provider prescribed (Usually 14 days), you will return the monitor device in the postage paid box. Once it is returned they will download the data collected and provide [place] with a report which the provider will then review and we will call you with those results. Important tips:  Avoid showering during the first 24 hours of wearing the monitor. Avoid excessive sweating to help maximize wear time. Do not submerge the device, no hot tubs, and no  swimming pools. Keep any lotions or oils away from the patch. After 24 hours you may shower with the patch on. Take brief showers with your back facing the shower head.  Do not remove patch once it has been placed because that will interrupt data and decrease adhesive wear time. Push the button when you have any symptoms and write down what you were feeling. Once you have completed wearing your monitor, remove and place into box which has postage paid and place in your outgoing mailbox.  If for some reason you have misplaced your box then call our office and we can provide another box and/or mail it off for you  Follow-Up: At [facility], you and your health needs are our priority.  As part of our continuing mission to provide you with exceptional heart care, we have created designated Provider Care Teams.  These Care Teams include your primary Cardiologist (physician) and Advanced Practice Providers (APPs -  Physician Assistants and Nurse Practitioners) who all work together to provide you with the care you need, when you need it.  We recommend signing up for the patient portal called "MyChart".  Sign up information is provided on this After Visit Summary.  MyChart is used to connect with patients for Virtual Visits (Telemedicine).  Patients are able to view lab/test results, encounter notes, upcoming appointments, etc.  Non-urgent messages can be sent to your provider as well.   To learn more about what you can do with MyChart, go to [email or web address].    Your next appointment:   6 month(s)  Provider:   Gwyndolyn Kaufman,  MD

## 2022-08-30 ENCOUNTER — Ambulatory Visit (INDEPENDENT_AMBULATORY_CARE_PROVIDER_SITE_OTHER): Payer: BC Managed Care – PPO

## 2022-08-30 ENCOUNTER — Ambulatory Visit (HOSPITAL_BASED_OUTPATIENT_CLINIC_OR_DEPARTMENT_OTHER)
Admission: RE | Admit: 2022-08-30 | Discharge: 2022-08-30 | Disposition: A | Discharge status: Home / Self Care | Payer: Self-pay | Source: Ambulatory Visit | Attending: Cardiology | Admitting: Cardiology

## 2022-08-30 DIAGNOSIS — E785 Hyperlipidemia, unspecified: Secondary | ICD-10-CM | POA: Diagnosis not present

## 2022-08-30 DIAGNOSIS — I252 Old myocardial infarction: Secondary | ICD-10-CM | POA: Insufficient documentation

## 2022-08-30 DIAGNOSIS — I2542 Coronary artery dissection: Secondary | ICD-10-CM

## 2022-08-30 DIAGNOSIS — E559 Vitamin D deficiency, unspecified: Secondary | ICD-10-CM | POA: Insufficient documentation

## 2022-08-30 LAB — ECHOCARDIOGRAM COMPLETE
Area-P 1/2: 3.99 cm2
S' Lateral: 2.09 cm

## 2022-12-01 ENCOUNTER — Encounter: Payer: Self-pay | Admitting: Cardiology

## 2023-02-02 ENCOUNTER — Ambulatory Visit: Payer: BC Managed Care – PPO | Admitting: Cardiology

## 2023-02-14 ENCOUNTER — Ambulatory Visit: Payer: BC Managed Care – PPO | Admitting: Internal Medicine

## 2023-05-31 ENCOUNTER — Encounter: Payer: Self-pay | Admitting: Internal Medicine

## 2023-05-31 ENCOUNTER — Ambulatory Visit: Payer: 59 | Attending: Internal Medicine | Admitting: Internal Medicine

## 2023-05-31 VITALS — BP 122/86 | HR 68 | Ht 66.5 in | Wt 243.0 lb

## 2023-05-31 DIAGNOSIS — R1011 Right upper quadrant pain: Secondary | ICD-10-CM | POA: Diagnosis not present

## 2023-05-31 DIAGNOSIS — I214 Non-ST elevation (NSTEMI) myocardial infarction: Secondary | ICD-10-CM

## 2023-05-31 DIAGNOSIS — R1010 Upper abdominal pain, unspecified: Secondary | ICD-10-CM

## 2023-05-31 NOTE — Patient Instructions (Addendum)
Medication Instructions:  Your physician recommends that you continue on your current medications as directed. Please refer to the Current Medication list given to you today.  *If you need a refill on your cardiac medications before your next appointment, please call your pharmacy*  Lab Work: None  Testing/Procedures: Abdominal Ultrasound of right upper quadrant at [facility] (see handout for locations)  Follow-Up: At [facility], you and your health needs are our priority.  As part of our continuing mission to provide you with exceptional heart care, we have created designated Provider Care Teams.  These Care Teams include your primary Cardiologist (physician) and Advanced Practice Providers (APPs -  Physician Assistants and Nurse Practitioners) who all work together to provide you with the care you need, when you need it.  Your next appointment:   1 year(s)  Provider:   Parke Poisson, MD

## 2023-05-31 NOTE — Progress Notes (Signed)
Cardiology Office Note:  .   Date:  05/31/2023  ID:  Stacey Garner, DOB Mar 11, 1971, MRN [id number] PCP: Leonard Downing  [facility] Providers Cardiologist:  Parke Poisson, MD    History of Present Illness: .   Stacey Garner is a 53 y.o. female.  Discussed the use of AI scribe software for clinical note transcription with the patient, who gave verbal consent to proceed.  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 The edema was first noticed in March of the previous year during a trip to [place] and has been intermittent since, with some worsening during the summer months. She denies any associated chest pain or dyspnea.   In addition, the patient reports experiencing right upper quadrant pain, which she describes as similar to the pain she experienced prior to her cardiac event in 2014. The pain is intermittent, occurring three to four times a week, and is often associated with periods of high stress. 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  The patient also mentions that she has stopped taking her rosuvastatin medication due to forgetting to bring it during her travels. She expresses a willingness to restart the medication.        ROS: negative except per HPI above.  Studies Reviewed: Marland Kitchen   EKG Interpretation Date/Time:  Thursday May 31 2023 10:15:47 EST Ventricular Rate:  68 PR Interval:  168 QRS Duration:  82 QT Interval:  402 QTC Calculation: 427 R Axis:   92  Text Interpretation: Normal sinus rhythm Rightward axis When compared with ECG of 14-Aug-2012 07:19, T wave inversion no longer evident in Lateral leads Confirmed by Weston Brass ([id number]) on 05/31/2023 10:21:46 AM    Results   LABS LDL:  135 (05/2022) Triglycerides: 229 (05/2022)  RADIOLOGY Coronary calcium score: Zero (2024)  DIAGNOSTIC Echocardiogram: Grossly normal findings, grade one diastolic dysfunction, normal global longitudinal strain, no valvular heart disease (08/2022) EKG: Normal (05/31/2023)     Risk Assessment/Calculations:    Physical Exam:   VS:  BP 122/86   Pulse 68   Ht 5' 6.5" (1.689 m)   Wt 243 lb (110.2 kg)   SpO2 96%   BMI 38.63 kg/m    Wt Readings from Last 3 Encounters:  05/31/23 243 lb (110.2 kg)  07/19/22 241 lb 4.8 oz (109.5 kg)  02/09/22 227 lb 8 oz (103.2 kg)     Physical Exam   CHEST: Lungs clear to auscultation. CARDIOVASCULAR: Heart sounds normal.     GEN: Well nourished, well developed in no acute distress NECK: No JVD; No carotid bruits CARDIAC: RRR, no murmurs, rubs, gallops RESPIRATORY:  Clear to auscultation without rales, wheezing or rhonchi  ABDOMEN: Soft, non-tender, non-distended EXTREMITIES:  No edema; No deformity   ASSESSMENT AND PLAN: .    Assessment & Plan NSTEMI (non-ST elevated myocardial infarction) (HCC)  Upper abdominal pain  Right upper quadrant abdominal pain   Assessment and Plan    History of SCAD No recurrence of chest pain or symptoms similar to initial event. Stress identified as a potential trigger for SCAD and current right upper quadrant pain. -Continue current management and stress reduction strategies. -Resume Rosuvastatin for cholesterol control. -Contact physician immediately if chest pain or symptoms  similar to initial event occur.  Right Upper Quadrant Pain Recurrent episodes, potentially stress-related. No clear cardiac relation identified. -Order right upper quadrant ultrasound to evaluate for potential gallbladder or liver issues.  Lower Extremity Edema Occasional episodes, primarily in hot weather or during travel. No persistent edema noted. -Continue current management. Report if edema does not resolve with cooling and  elevation.  Hyperlipidemia Patient temporarily discontinued Rosuvastatin. Last LDL was 135 and triglycerides were 229 while fasting. -Resume Rosuvastatin. -Check lipid panel at annual physical.  Follow-up in 1 year.

## 2023-06-12 ENCOUNTER — Ambulatory Visit
Admission: RE | Admit: 2023-06-12 | Discharge: 2023-06-12 | Disposition: A | Discharge status: Home / Self Care | Payer: 59 | Source: Ambulatory Visit | Attending: Internal Medicine | Admitting: Internal Medicine

## 2023-06-12 ENCOUNTER — Encounter: Payer: Self-pay | Admitting: *Deleted

## 2023-06-12 DIAGNOSIS — R1011 Right upper quadrant pain: Secondary | ICD-10-CM
